# Patient Record
Sex: Male | Born: 1975
Health system: Southern US, Community
[De-identification: ages and names within clinical notes are randomized; demographics above are authoritative.]

## PROBLEM LIST (undated history)

## (undated) DIAGNOSIS — S73199A Other sprain of unspecified hip, initial encounter: Secondary | ICD-10-CM

## (undated) DIAGNOSIS — T7840XA Allergy, unspecified, initial encounter: Secondary | ICD-10-CM

## (undated) HISTORY — DX: Allergy, unspecified, initial encounter: T78.40XA

## (undated) HISTORY — DX: Other sprain of unspecified hip, initial encounter: S73.199A

## (undated) HISTORY — PX: WISDOM TOOTH EXTRACTION: SHX21

---

## 1999-03-25 ENCOUNTER — Encounter: Payer: Self-pay | Admitting: *Deleted

## 1999-03-25 ENCOUNTER — Emergency Department (HOSPITAL_COMMUNITY): Admission: EM | Admit: 1999-03-25 | Discharge: 1999-03-25 | Payer: Self-pay | Admitting: *Deleted

## 1999-04-01 ENCOUNTER — Emergency Department (HOSPITAL_COMMUNITY): Admission: EM | Admit: 1999-04-01 | Discharge: 1999-04-01 | Payer: Self-pay | Admitting: Emergency Medicine

## 2001-08-13 ENCOUNTER — Emergency Department (HOSPITAL_COMMUNITY): Admission: EM | Admit: 2001-08-13 | Discharge: 2001-08-13 | Payer: Self-pay | Admitting: Emergency Medicine

## 2009-10-29 ENCOUNTER — Ambulatory Visit: Payer: Self-pay | Admitting: Sports Medicine

## 2009-10-29 DIAGNOSIS — M7660 Achilles tendinitis, unspecified leg: Secondary | ICD-10-CM | POA: Insufficient documentation

## 2009-10-29 DIAGNOSIS — M79609 Pain in unspecified limb: Secondary | ICD-10-CM | POA: Insufficient documentation

## 2009-11-26 ENCOUNTER — Ambulatory Visit: Payer: Self-pay | Admitting: Sports Medicine

## 2010-03-27 NOTE — Assessment & Plan Note (Signed)
Summary: NP,LEG PAIN WHILE RUNNING,MC   Vital Signs:  Patient profile:   35 year old male Height:      72 inches Weight:      175 pounds BMI:     23.82 BP sitting:   101 / 70  Vitals Entered By: Lillia Pauls CMA (October 29, 2009 10:45 AM)  History of Present Illness:  Bilat achilles that has been gradually worsening since mid July 2011.  Attempted first barefoot fun prior to onset; shortly after noticed sensation of achilles tendon tightness and later developed pain.  Only did one barefoot run and then went back to shoes.  Was attempting to train for 1/2 marathon but pain worsened with increase in mileage.  Knee pain (diffuse quads tendons) developed several weeks after achilles pain - pt wonders if this was caused by a change in his mechanics; this pain would also worsen with activity.  Pain has progressed to the point where he stopped running 2 weeks ago.  No parasthesias, LE weakness, or prior injurys.  Started running Oct 2010 - did P90x prior as main form of exercise.     Allergies (verified): No Known Drug Allergies  Physical Exam  General:  Well-developed,well-nourished,in no acute distress; alert,appropriate and cooperative throughout examination Msk:  good overall strength of hip abductors and flexors equal leg length moderate arch w only slt pronation  slt thickening in RT AT and mildly TTP LT AT feels norm and does not seem tender  gait is midfoot strike running form looks good pronation is not excessive Additional Exam:  MSK [place] thickened nodule on RT AT of 0.58 no noevessels no tears some edema noted on trans scan  LT AT is about 0.52 at this area and essentially looks norm  images saved   Impression & Recommendations:  Problem # 1:  HEEL PAIN, BILATERAL (ICD-729.5)  Left is pretty mild will use exercises and ice  RT is more severe and would use top ketoprofen gel two times a day to qid  heel lifts bilat sports insoles  Orders: Sports Insoles  (E4540)  Problem # 2:  ACHILLES TENDINITIS (ICD-726.71)  RT has nodule but no tear will begin rehab protocol OK to run with heel lift but keep effort to 70%  reck in 1 month  Orders: Sports Insoles (J8119)  Complete Medication List: 1)  Ketoprofen Gel 20%  .... Aaa tid Prescriptions: KETOPROFEN GEL 20% AAA TID  #60 GRMS x PRN   Entered by:   Lillia Pauls CMA   Authorized by:   Enid Baas MD   Signed by:   Lillia Pauls CMA on 10/29/2009   Method used:   Print then Give to Patient   RxID:   [id number]

## 2010-03-27 NOTE — Assessment & Plan Note (Signed)
Summary: F/U ACHILLIES,MC   Vital Signs:  Patient profile:   35 year old male BP sitting:   104 / 68  Vitals Entered By: Rochele Pages RN (November 26, 2009 10:50 AM)  History of Present Illness: 35 yo M with bilateral achilles pain here for follow up.  Since his last visit, patient has not been able to do the exercises due to pain after trying them. He also has not been able to resume running. He feels some stiffness in the morning and then discomfort after the exercises. He reports new pain in his R heel going under the heel after trying the exercises.  He only recently got the prescription filled for the Ketoprofen gel and has not really tried it.  Allergies: No Known Drug Allergies  Physical Exam  General:  Well-developed,well-nourished,in no acute distress; alert,appropriate and cooperative throughout examination Msk:  Foot: Feet appear symmetric without edema or deformities.  Pain with compression of R achilles just above insertion on calcaneus. Small nodule palpable, consistent with previous exam.  5/5 strength, full ROM on dorsiflexion, plantarflexion, inversion, eversion bilaterally.  Limited ROM of great toe bilaterally with 10-15 degrees of extension, 45-60 degrees flexion. Neurologic:  Neutral gait.   Impression & Recommendations:  Problem # 1:  ACHILLES TENDINITIS (ICD-726.71) Modified exercises as detailed in Pt. Instructions so that patient will be able to tolerate them without pain. Instructions include gradual process for increasing difficulty of exercises. Pt. will start ketoprofen gel.  will rescan on RTC if noit improved consider NTG  Return to clinic in 6 weeks for f/u. Orders: Foot Orthosis ( Arch Strap/Heel Cup) [phone]) Sports Insoles [phone])  Complete Medication List: 1)  Ketoprofen Gel 20%  .... Aaa tid  Patient Instructions: 1)  Begin exercises off a book about 2 in high 2)  go down on both feet - down slow and up quickly 3)  3 sets of 15 with  knees straight 4)  3 setrs of 15 with knees bent 5)  When this does not hurt; 6)  start 1 set on RT leg with only 1 foot 7)  after that is pain free progress to 2 sets on 1 foot only and gradually to 3 sets 8)  this is absolutely essential to eventually healing the nodule 9)  after pain free on 3 sets of 15 with 1 leg - you could start some easy running 10)  now safer to use elliptical or treadmill 11)  reck 6 weeks

## 2010-06-16 ENCOUNTER — Encounter: Payer: Self-pay | Admitting: *Deleted

## 2011-12-04 ENCOUNTER — Ambulatory Visit (INDEPENDENT_AMBULATORY_CARE_PROVIDER_SITE_OTHER): Payer: BC Managed Care – PPO | Admitting: Family Medicine

## 2011-12-04 VITALS — BP 120/70 | Ht 71.0 in | Wt 175.0 lb

## 2011-12-04 DIAGNOSIS — M84369A Stress fracture, unspecified tibia and fibula, initial encounter for fracture: Secondary | ICD-10-CM | POA: Insufficient documentation

## 2011-12-04 MED ORDER — MELOXICAM 15 MG PO TABS: 15.0000 mg | ORAL_TABLET | Freq: Every day | ORAL | Status: DC

## 2011-12-04 NOTE — Assessment & Plan Note (Signed)
Patient hasn't seen ultrasound today with potential stress reaction but mostly hypoechoic changes surrounding the bone. There is also findings that show anterior tibialis stress and potential injury. Patient given sports insults today to try to help with the vibration. Patient to the compression sleeve to try to help take the pressure off of the anterior tibialis Patient given exercises and stretches to on a regular basis and encouraged to cross train. Patient will come back in 3-4 weeks for further evaluation if still having any pain. Gave patient red flags and when a stress reaction recurs and becomes a true stress fracture.

## 2011-12-04 NOTE — Patient Instructions (Addendum)
Very nice to meet you Try the sleeve compression with exercise and 2 hours afterward.  Wear the insoles Do the exercises given.  Also would cross train doing biking and swimming or elliptical. You can run 2 times a week.  I am sending in a medicine for you as well. Take 1 pill daily for 2 weeks then as needed.  I want to see you again in 3-4 weeks to make sure you are ready for your marathon.

## 2011-12-04 NOTE — Progress Notes (Signed)
Chief complaint: Left lower leg pain  History of present illness: Patient is a 36 year old male who is running in trying to increase his endurance to do the [employer]. Patient over the course of last couple weeks he started having pain in his left lower treatment. Patient states that the pain has increased recently. Patient states that it mostly happens over the tibia bone. Patient was seen in urgent care facility where he did have x-rays done. X-rays were reviewed by me today and does not show any bony abnormalities or any signs of fracture or stress reaction. Patient states that it is still very tender and is stopping him from running. Patient noticed that the first time after he ran 12-1/2 miles which was his for this he ran at one time in quite a while. Patient denies any injury to this had previously. Patient has now stopped running quite some much and has had improvement over the course of last week. Patient denies any numbness or radiation of pain or any swelling. Patient states that the pain has woken him up at night denies any fevers, chills, or any abnormal weight loss.  Past medical surgical and family history reviewed without any changes from previous exam back in 2001  Physical exam Blood pressure 120/70, height 5\' 11"  (1.803 m), weight 175 lb (79.379 kg). General: No apparent distress alert and oriented mood and affect normal Respiratory: Patient states pulses is a does not appear short of breath Gait analysis shows some external tibial rotation of the right side. Patient is favoring his left leg. Skin: Warm dry intact no signs of infection Left leg exam: Shows full range of motion of the knee and is nontender as well as the ankle which is also nontender. Patient does have pain on the medial aspect of the anterior tibia as well as pain over the anterior tibialis muscle. These occurred midshaft minimal almost directly in the middle. Neurovascularly intact distally with 2+ patellar  and Achilles tendon reflex  Ultrasound performed and interpreted by me today. Patient has findings of a potential anterior tibialis tear with some neovascularization and hypoechoic changes where it attaches in the area where patient is most tender. In addition this patient does have mild stress reaction of the bone but no cortical defect. Patient does have overlying hypoechoic changes on the bone signaling potential stress reaction.

## 2012-01-01 ENCOUNTER — Ambulatory Visit: Payer: BC Managed Care – PPO | Admitting: Family Medicine

## 2013-07-14 ENCOUNTER — Encounter: Payer: Self-pay | Admitting: Family Medicine

## 2013-07-14 ENCOUNTER — Ambulatory Visit
Admission: RE | Admit: 2013-07-14 | Discharge: 2013-07-14 | Disposition: A | Discharge status: Home / Self Care | Payer: BC Managed Care – PPO | Source: Ambulatory Visit | Attending: Family Medicine | Admitting: Family Medicine

## 2013-07-14 ENCOUNTER — Ambulatory Visit (INDEPENDENT_AMBULATORY_CARE_PROVIDER_SITE_OTHER): Payer: BC Managed Care – PPO | Admitting: Family Medicine

## 2013-07-14 VITALS — BP 118/75 | Ht 72.0 in | Wt 185.0 lb

## 2013-07-14 DIAGNOSIS — M79609 Pain in unspecified limb: Secondary | ICD-10-CM

## 2013-07-14 DIAGNOSIS — M79671 Pain in right foot: Secondary | ICD-10-CM

## 2013-07-14 NOTE — Patient Instructions (Signed)
Thank you for coming in today  We are concerned that you may have a stress reaction of the 5th metatarsal Get xrays Post op shoe for 3 weeks Exercise okay if it is not painful - recommend bike, swimming. No hiking, running. Elliptical only if pain free.  Followup 3 weeks

## 2013-07-14 NOTE — Progress Notes (Signed)
CC: Right foot pain HPI: Patient is a very pleasant 38 year old male referred by Ellamae Sia for evaluation of right lateral foot pain. He states that he sprained his ankle on April 4. This was a high ankle sprain. He treated this with a lace up ankle brace. He noticed that after the ankle sprain while he was wearing the brace he started to get lateral foot pain at the base of his fifth metatarsal. He has been in rehabilitation for his ankle sprain with Ellamae Sia but continues to have persistent and significant lateral foot pain and swelling but is not improving. John had some concern that this might be a Jones fracture and referred him here for evaluation. He states that his pain is exacerbated by walking and hiking. He also gets some pain if he crosses his legs.  ROS: As above in the HPI. All other systems are stable or negative.  OBJECTIVE: APPEARANCE:  Patient in no acute distress.The patient appeared well nourished and normally developed. HEENT: No scleral icterus. Conjunctiva non-injected Resp: Non labored Skin: No rash MSK:  Right Foot exam:  - No swelling or deformity - Full range of motion of the ankle - Strength is 5 out of 5 in the ankle in all directions - No pain with plantarflexion and eversion of the foot - He is tender predominately at the base of the fifth metatarsal. There is also some midshaft tenderness. - Non-tender just proximal to the base of the fifth where the peroneal brevis inserts  MSK [place]: Ultrasound of the right foot was performed today to evaluate the fifth metatarsal. Fifth metatarsal visualized in transverse and longitudinal views. No cortical disruption. No hypoechoic change overlying the bony cortex. There was increased Doppler signal on the cortex at the area of the patient's pain suggestive of possible stress reaction. Distal Peroneal brevis tendon has small hypoechoic fluid pocket overlying it but this is nontender and not the site of the  patient's pain.  ASSESSMENT: #1. Right lateral foot pain with concern for fifth metatarsal stress reaction versus stress fracture   PLAN: We will get x-rays of the foot to rule out fracture. His pain has been present for several weeks now and so there was a fracture we should see evidence of callus formation. We will place him in a postop shoe for the next 3 weeks. He was encouraged to avoid any activity that increases his pain. We will see him back in 3 weeks.

## 2013-07-18 ENCOUNTER — Telehealth: Payer: Self-pay | Admitting: Family Medicine

## 2013-07-18 NOTE — Telephone Encounter (Signed)
Called patient to discuss xray and answer questions. Unfortunately had worse pain in post op shoe. Went back into sneaker and still with pain but less than in post op shoe. We will have him go non-WB on crutches for 1 week and then retry post op shoe. If this is still painful go back to sneaker. Move up followup to June 5th for reeval. May need to reconsider etiology of pain as possibly peroneus instead of 5th MT as 5th MT should not hurt in post op shoe. May need to consider walking boot on followup.

## 2013-08-04 ENCOUNTER — Ambulatory Visit: Payer: BC Managed Care – PPO | Admitting: Family Medicine

## 2016-04-10 DIAGNOSIS — Z0000 Encounter for general adult medical examination without abnormal findings: Secondary | ICD-10-CM | POA: Diagnosis not present

## 2016-04-10 DIAGNOSIS — Z125 Encounter for screening for malignant neoplasm of prostate: Secondary | ICD-10-CM | POA: Diagnosis not present

## 2016-04-17 DIAGNOSIS — Z0000 Encounter for general adult medical examination without abnormal findings: Secondary | ICD-10-CM | POA: Diagnosis not present

## 2016-05-29 DIAGNOSIS — S76111A Strain of right quadriceps muscle, fascia and tendon, initial encounter: Secondary | ICD-10-CM | POA: Diagnosis not present

## 2016-05-29 DIAGNOSIS — M79671 Pain in right foot: Secondary | ICD-10-CM | POA: Diagnosis not present

## 2016-06-26 DIAGNOSIS — S76111A Strain of right quadriceps muscle, fascia and tendon, initial encounter: Secondary | ICD-10-CM | POA: Diagnosis not present

## 2016-06-26 DIAGNOSIS — M79671 Pain in right foot: Secondary | ICD-10-CM | POA: Diagnosis not present

## 2016-11-05 DIAGNOSIS — M545 Low back pain: Secondary | ICD-10-CM | POA: Diagnosis not present

## 2016-12-29 DIAGNOSIS — M25551 Pain in right hip: Secondary | ICD-10-CM | POA: Diagnosis not present

## 2016-12-29 DIAGNOSIS — M25552 Pain in left hip: Secondary | ICD-10-CM | POA: Diagnosis not present

## 2017-01-21 DIAGNOSIS — M25552 Pain in left hip: Secondary | ICD-10-CM | POA: Diagnosis not present

## 2017-01-21 DIAGNOSIS — M25551 Pain in right hip: Secondary | ICD-10-CM | POA: Diagnosis not present

## 2017-01-28 DIAGNOSIS — M25551 Pain in right hip: Secondary | ICD-10-CM | POA: Diagnosis not present

## 2017-01-28 DIAGNOSIS — M25552 Pain in left hip: Secondary | ICD-10-CM | POA: Diagnosis not present

## 2017-02-23 DIAGNOSIS — S73199A Other sprain of unspecified hip, initial encounter: Secondary | ICD-10-CM

## 2017-02-23 HISTORY — DX: Other sprain of unspecified hip, initial encounter: S73.199A

## 2017-03-11 ENCOUNTER — Encounter: Payer: Self-pay | Admitting: Family Medicine

## 2017-03-11 ENCOUNTER — Ambulatory Visit (INDEPENDENT_AMBULATORY_CARE_PROVIDER_SITE_OTHER): Payer: 59 | Admitting: Family Medicine

## 2017-03-11 VITALS — BP 110/78 | HR 68 | Temp 97.4°F | Ht 71.5 in | Wt 176.2 lb

## 2017-03-11 DIAGNOSIS — Z23 Encounter for immunization: Secondary | ICD-10-CM

## 2017-03-11 DIAGNOSIS — Z0000 Encounter for general adult medical examination without abnormal findings: Secondary | ICD-10-CM | POA: Diagnosis not present

## 2017-03-11 LAB — HEPATIC FUNCTION PANEL
ALBUMIN: 4.7 g/dL (ref 3.5–5.2)
ALK PHOS: 45 U/L (ref 39–117)
ALT: 24 U/L (ref 0–53)
AST: 21 U/L (ref 0–37)
BILIRUBIN DIRECT: 0.2 mg/dL (ref 0.0–0.3)
Total Bilirubin: 0.8 mg/dL (ref 0.2–1.2)
Total Protein: 7.1 g/dL (ref 6.0–8.3)

## 2017-03-11 LAB — POC URINALSYSI DIPSTICK (AUTOMATED)
Bilirubin, UA: NEGATIVE
GLUCOSE UA: NEGATIVE
Ketones, UA: NEGATIVE
Leukocytes, UA: NEGATIVE
NITRITE UA: NEGATIVE
PROTEIN UA: NEGATIVE
RBC UA: NEGATIVE
Spec Grav, UA: 1.015 (ref 1.010–1.025)
UROBILINOGEN UA: 0.2 U/dL
pH, UA: 7.5 (ref 5.0–8.0)

## 2017-03-11 LAB — CBC WITH DIFFERENTIAL/PLATELET
BASOS PCT: 0.7 % (ref 0.0–3.0)
Basophils Absolute: 0 10*3/uL (ref 0.0–0.1)
EOS ABS: 0.2 10*3/uL (ref 0.0–0.7)
Eosinophils Relative: 3.8 % (ref 0.0–5.0)
HEMATOCRIT: 47.9 % (ref 39.0–52.0)
HEMOGLOBIN: 15.9 g/dL (ref 13.0–17.0)
LYMPHS PCT: 36.5 % (ref 12.0–46.0)
Lymphs Abs: 1.8 10*3/uL (ref 0.7–4.0)
MCHC: 33.2 g/dL (ref 30.0–36.0)
MCV: 92.3 fl (ref 78.0–100.0)
Monocytes Absolute: 0.4 10*3/uL (ref 0.1–1.0)
Monocytes Relative: 8.8 % (ref 3.0–12.0)
Neutro Abs: 2.4 10*3/uL (ref 1.4–7.7)
Neutrophils Relative %: 50.2 % (ref 43.0–77.0)
Platelets: 204 10*3/uL (ref 150.0–400.0)
RBC: 5.19 Mil/uL (ref 4.22–5.81)
RDW: 12.8 % (ref 11.5–15.5)
WBC: 4.8 10*3/uL (ref 4.0–10.5)

## 2017-03-11 LAB — BASIC METABOLIC PANEL
BUN: 12 mg/dL (ref 6–23)
CO2: 33 mEq/L — ABNORMAL HIGH (ref 19–32)
Calcium: 9.3 mg/dL (ref 8.4–10.5)
Chloride: 101 mEq/L (ref 96–112)
Creatinine, Ser: 0.79 mg/dL (ref 0.40–1.50)
GFR: 113.59 mL/min (ref >60.00)
Glucose, Bld: 90 mg/dL (ref 70–99)
Potassium: 4.3 mEq/L (ref 3.5–5.1)
Sodium: 139 mEq/L (ref 135–145)

## 2017-03-11 LAB — LIPID PANEL
Cholesterol: 198 mg/dL (ref 0–200)
HDL: 60.6 mg/dL (ref >39.00)
LDL Cholesterol: 123 mg/dL — ABNORMAL HIGH (ref 0–99)
NonHDL: 137.59
Total CHOL/HDL Ratio: 3
Triglycerides: 75 mg/dL (ref 0.0–149.0)
VLDL: 15 mg/dL (ref 0.0–40.0)

## 2017-03-11 LAB — TSH: TSH: 1.58 u[IU]/mL (ref 0.35–4.50)

## 2017-03-11 MED ORDER — FLUTICASONE PROPIONATE 50 MCG/ACT NA SUSP: 2.0000 | Freq: Every day | NASAL | 11 refills | Status: DC

## 2017-03-11 NOTE — Progress Notes (Signed)
   Subjective:    Patient ID: Marcus Bell, male    DOB: 27-Apr-1975, 42 y.o.   MRN: [id number]  HPI 42 yr old male to establish and for a well exam. He feels great and has no concerns. He runs for exercise. He is an Teaching laboratory technicianassociate pastor.    Review of Systems  Constitutional: Negative.   HENT: Negative.   Eyes: Negative.   Respiratory: Negative.   Cardiovascular: Negative.   Gastrointestinal: Negative.   Genitourinary: Negative.   Musculoskeletal: Negative.   Skin: Negative.   Neurological: Negative.   Psychiatric/Behavioral: Negative.        Objective:   Physical Exam  Constitutional: He is oriented to person, place, and time. He appears well-developed and well-nourished. No distress.  HENT:  Head: Normocephalic and atraumatic.  Right Ear: External ear normal.  Left Ear: External ear normal.  Nose: Nose normal.  Mouth/Throat: Oropharynx is clear and moist. No oropharyngeal exudate.  Eyes: Conjunctivae and EOM are normal. Pupils are equal, round, and reactive to light. Right eye exhibits no discharge. Left eye exhibits no discharge. No scleral icterus.  Neck: Neck supple. No JVD present. No tracheal deviation present. No thyromegaly present.  Cardiovascular: Normal rate, regular rhythm, normal heart sounds and intact distal pulses. Exam reveals no gallop and no friction rub.  No murmur heard. Pulmonary/Chest: Effort normal and breath sounds normal. No respiratory distress. He has no wheezes. He has no rales. He exhibits no tenderness.  Abdominal: Soft. Bowel sounds are normal. He exhibits no distension and no mass. There is no tenderness. There is no rebound and no guarding.  Genitourinary: Rectum normal, prostate normal and penis normal. Rectal exam shows guaiac negative stool. No penile tenderness.  Musculoskeletal: Normal range of motion. He exhibits no edema or tenderness.  Lymphadenopathy:    He has no cervical adenopathy.  Neurological: He is alert and oriented to person,  place, and time. He has normal reflexes. No cranial nerve deficit. He exhibits normal muscle tone. Coordination normal.  Skin: Skin is warm and dry. No rash noted. He is not diaphoretic. No erythema. No pallor.  Psychiatric: He has a normal mood and affect. His behavior is normal. Judgment and thought content normal.          Assessment & Plan:  Intro visit and well exam. We discussed diet and exercise. Get fasting labs.  Gershon CraneStephen Cayden Rautio, MD

## 2017-04-04 ENCOUNTER — Encounter: Payer: Self-pay | Admitting: Family Medicine

## 2017-04-05 ENCOUNTER — Encounter: Payer: Self-pay | Admitting: *Deleted

## 2017-04-05 NOTE — Telephone Encounter (Signed)
Have him make a 15 minute OV to go over all these things. With the family hx of prostate cancer, he needs to have his prostate checked every year after the age of 42. We did a recent physical, but he needs a digital exam and a PSA. We can answer his other questions at the same visit

## 2017-04-16 ENCOUNTER — Ambulatory Visit (INDEPENDENT_AMBULATORY_CARE_PROVIDER_SITE_OTHER): Payer: 59 | Admitting: Family Medicine

## 2017-04-16 DIAGNOSIS — Z23 Encounter for immunization: Secondary | ICD-10-CM

## 2017-05-07 ENCOUNTER — Ambulatory Visit: Payer: 59

## 2017-07-16 DIAGNOSIS — M7662 Achilles tendinitis, left leg: Secondary | ICD-10-CM | POA: Diagnosis not present

## 2017-09-02 DIAGNOSIS — M7661 Achilles tendinitis, right leg: Secondary | ICD-10-CM | POA: Diagnosis not present

## 2017-09-02 DIAGNOSIS — M7662 Achilles tendinitis, left leg: Secondary | ICD-10-CM | POA: Diagnosis not present

## 2017-09-21 DIAGNOSIS — S70362D Insect bite (nonvenomous), left thigh, subsequent encounter: Secondary | ICD-10-CM | POA: Diagnosis not present

## 2017-11-08 DIAGNOSIS — M7661 Achilles tendinitis, right leg: Secondary | ICD-10-CM | POA: Diagnosis not present

## 2017-11-08 DIAGNOSIS — M7662 Achilles tendinitis, left leg: Secondary | ICD-10-CM | POA: Diagnosis not present

## 2017-11-23 DIAGNOSIS — M7661 Achilles tendinitis, right leg: Secondary | ICD-10-CM | POA: Diagnosis not present

## 2017-11-23 DIAGNOSIS — M7662 Achilles tendinitis, left leg: Secondary | ICD-10-CM | POA: Diagnosis not present

## 2017-11-29 DIAGNOSIS — M7661 Achilles tendinitis, right leg: Secondary | ICD-10-CM | POA: Diagnosis not present

## 2017-11-29 DIAGNOSIS — M7662 Achilles tendinitis, left leg: Secondary | ICD-10-CM | POA: Diagnosis not present

## 2017-12-02 DIAGNOSIS — M7662 Achilles tendinitis, left leg: Secondary | ICD-10-CM | POA: Diagnosis not present

## 2017-12-02 DIAGNOSIS — M7661 Achilles tendinitis, right leg: Secondary | ICD-10-CM | POA: Diagnosis not present

## 2017-12-09 DIAGNOSIS — M7661 Achilles tendinitis, right leg: Secondary | ICD-10-CM | POA: Diagnosis not present

## 2017-12-09 DIAGNOSIS — M7662 Achilles tendinitis, left leg: Secondary | ICD-10-CM | POA: Diagnosis not present

## 2017-12-13 DIAGNOSIS — M25551 Pain in right hip: Secondary | ICD-10-CM | POA: Diagnosis not present

## 2017-12-13 DIAGNOSIS — M7061 Trochanteric bursitis, right hip: Secondary | ICD-10-CM | POA: Diagnosis not present

## 2017-12-16 DIAGNOSIS — M9904 Segmental and somatic dysfunction of sacral region: Secondary | ICD-10-CM | POA: Diagnosis not present

## 2017-12-16 DIAGNOSIS — M9902 Segmental and somatic dysfunction of thoracic region: Secondary | ICD-10-CM | POA: Diagnosis not present

## 2017-12-16 DIAGNOSIS — M9903 Segmental and somatic dysfunction of lumbar region: Secondary | ICD-10-CM | POA: Diagnosis not present

## 2017-12-20 DIAGNOSIS — M9903 Segmental and somatic dysfunction of lumbar region: Secondary | ICD-10-CM | POA: Diagnosis not present

## 2017-12-20 DIAGNOSIS — M9902 Segmental and somatic dysfunction of thoracic region: Secondary | ICD-10-CM | POA: Diagnosis not present

## 2017-12-20 DIAGNOSIS — M9904 Segmental and somatic dysfunction of sacral region: Secondary | ICD-10-CM | POA: Diagnosis not present

## 2017-12-27 DIAGNOSIS — M9902 Segmental and somatic dysfunction of thoracic region: Secondary | ICD-10-CM | POA: Diagnosis not present

## 2017-12-27 DIAGNOSIS — M9903 Segmental and somatic dysfunction of lumbar region: Secondary | ICD-10-CM | POA: Diagnosis not present

## 2017-12-27 DIAGNOSIS — M9904 Segmental and somatic dysfunction of sacral region: Secondary | ICD-10-CM | POA: Diagnosis not present

## 2017-12-31 ENCOUNTER — Other Ambulatory Visit: Payer: Self-pay | Admitting: Family Medicine

## 2017-12-31 DIAGNOSIS — M25561 Pain in right knee: Secondary | ICD-10-CM

## 2018-01-03 DIAGNOSIS — M9902 Segmental and somatic dysfunction of thoracic region: Secondary | ICD-10-CM | POA: Diagnosis not present

## 2018-01-03 DIAGNOSIS — M9904 Segmental and somatic dysfunction of sacral region: Secondary | ICD-10-CM | POA: Diagnosis not present

## 2018-01-03 DIAGNOSIS — M9903 Segmental and somatic dysfunction of lumbar region: Secondary | ICD-10-CM | POA: Diagnosis not present

## 2018-01-17 DIAGNOSIS — M9904 Segmental and somatic dysfunction of sacral region: Secondary | ICD-10-CM | POA: Diagnosis not present

## 2018-01-17 DIAGNOSIS — M9902 Segmental and somatic dysfunction of thoracic region: Secondary | ICD-10-CM | POA: Diagnosis not present

## 2018-01-17 DIAGNOSIS — M9903 Segmental and somatic dysfunction of lumbar region: Secondary | ICD-10-CM | POA: Diagnosis not present

## 2018-01-25 DIAGNOSIS — M25552 Pain in left hip: Secondary | ICD-10-CM | POA: Diagnosis not present

## 2018-01-25 DIAGNOSIS — M25551 Pain in right hip: Secondary | ICD-10-CM | POA: Diagnosis not present

## 2018-01-25 DIAGNOSIS — M545 Low back pain: Secondary | ICD-10-CM | POA: Diagnosis not present

## 2018-01-26 NOTE — Progress Notes (Signed)
[facility] Marcus Bell [facility] by the undersigned to the LT deltoid. Lot# [id number] NDC: [id number] Mfg: [employer] Exp: 08/23/18

## 2018-02-07 DIAGNOSIS — M25551 Pain in right hip: Secondary | ICD-10-CM | POA: Diagnosis not present

## 2018-02-09 DIAGNOSIS — M25551 Pain in right hip: Secondary | ICD-10-CM | POA: Diagnosis not present

## 2018-03-23 DIAGNOSIS — M25551 Pain in right hip: Secondary | ICD-10-CM | POA: Diagnosis not present

## 2018-04-04 DIAGNOSIS — M25551 Pain in right hip: Secondary | ICD-10-CM | POA: Diagnosis not present

## 2018-04-04 DIAGNOSIS — M25552 Pain in left hip: Secondary | ICD-10-CM | POA: Diagnosis not present

## 2018-04-04 DIAGNOSIS — M545 Low back pain: Secondary | ICD-10-CM | POA: Diagnosis not present

## 2018-04-15 ENCOUNTER — Ambulatory Visit (INDEPENDENT_AMBULATORY_CARE_PROVIDER_SITE_OTHER): Payer: 59 | Admitting: Family Medicine

## 2018-04-15 ENCOUNTER — Encounter: Payer: 59 | Admitting: Family Medicine

## 2018-04-15 ENCOUNTER — Encounter: Payer: Self-pay | Admitting: Family Medicine

## 2018-04-15 VITALS — BP 110/62 | HR 66 | Temp 97.5°F | Ht 71.0 in | Wt 179.2 lb

## 2018-04-15 DIAGNOSIS — Z0000 Encounter for general adult medical examination without abnormal findings: Secondary | ICD-10-CM | POA: Diagnosis not present

## 2018-04-15 DIAGNOSIS — H9313 Tinnitus, bilateral: Secondary | ICD-10-CM | POA: Diagnosis not present

## 2018-04-15 NOTE — Progress Notes (Signed)
   Subjective:    Patient ID: Marcus Bell, male    DOB: 1975-08-18, 43 y.o.   MRN: [id number]  HPI Here for a well exam. He feels well but has one concern. About 2 months ago he developed a high pitched ringing in both ear. No pain or hearing loss. No dizziness. No particular exposure to loud noises that he remembers.    Review of Systems  Constitutional: Negative.   HENT: Positive for tinnitus.   Eyes: Negative.   Respiratory: Negative.   Cardiovascular: Negative.   Gastrointestinal: Negative.   Genitourinary: Negative.   Musculoskeletal: Negative.   Skin: Negative.   Neurological: Negative.   Psychiatric/Behavioral: Negative.        Objective:   Physical Exam Constitutional:      General: He is not in acute distress.    Appearance: He is well-developed. He is not diaphoretic.  HENT:     Head: Normocephalic and atraumatic.     Right Ear: External ear normal.     Left Ear: External ear normal.     Nose: Nose normal.     Mouth/Throat:     Pharynx: No oropharyngeal exudate.  Eyes:     General: No scleral icterus.       Right eye: No discharge.        Left eye: No discharge.     Conjunctiva/sclera: Conjunctivae normal.     Pupils: Pupils are equal, round, and reactive to light.  Neck:     Musculoskeletal: Neck supple.     Thyroid: No thyromegaly.     Vascular: No JVD.     Trachea: No tracheal deviation.  Cardiovascular:     Rate and Rhythm: Normal rate and regular rhythm.     Heart sounds: Normal heart sounds. No murmur. No friction rub. No gallop.   Pulmonary:     Effort: Pulmonary effort is normal. No respiratory distress.     Breath sounds: Normal breath sounds. No wheezing or rales.  Chest:     Chest wall: No tenderness.  Abdominal:     General: Bowel sounds are normal. There is no distension.     Palpations: Abdomen is soft. There is no mass.     Tenderness: There is no abdominal tenderness. There is no guarding or rebound.  Genitourinary:  Penis: Normal. No tenderness.      Scrotum/Testes: Normal.  Musculoskeletal: Normal range of motion.        General: No tenderness.  Lymphadenopathy:     Cervical: No cervical adenopathy.  Skin:    General: Skin is warm and dry.     Coloration: Skin is not pale.     Findings: No erythema or rash.  Neurological:     Mental Status: He is alert and oriented to person, place, and time.     Cranial Nerves: No cranial nerve deficit.     Motor: No abnormal muscle tone.     Coordination: Coordination normal.     Deep Tendon Reflexes: Reflexes are normal and symmetric. Reflexes normal.  Psychiatric:        Behavior: Behavior normal.        Thought Content: Thought content normal.        Judgment: Judgment normal.           Assessment & Plan:  Well exam. We discussed diet and exercise. He will return next week for fasting labs. Refer to ENT for the tinnitus. Gershon Crane, MD

## 2018-04-20 ENCOUNTER — Ambulatory Visit: Payer: 59

## 2018-04-20 DIAGNOSIS — Z0000 Encounter for general adult medical examination without abnormal findings: Secondary | ICD-10-CM

## 2018-04-20 LAB — HEPATIC FUNCTION PANEL
ALBUMIN: 4.5 g/dL (ref 3.5–5.2)
ALT: 21 U/L (ref 0–53)
AST: 18 U/L (ref 0–37)
Alkaline Phosphatase: 49 U/L (ref 39–117)
Bilirubin, Direct: 0.1 mg/dL (ref 0.0–0.3)
Total Bilirubin: 0.5 mg/dL (ref 0.2–1.2)
Total Protein: 6.8 g/dL (ref 6.0–8.3)

## 2018-04-20 LAB — BASIC METABOLIC PANEL
BUN: 15 mg/dL (ref 6–23)
CO2: 31 mEq/L (ref 19–32)
Calcium: 9.2 mg/dL (ref 8.4–10.5)
Chloride: 102 mEq/L (ref 96–112)
Creatinine, Ser: 0.93 mg/dL (ref 0.40–1.50)
GFR: 88.08 mL/min (ref >60.00)
Glucose, Bld: 91 mg/dL (ref 70–99)
Potassium: 4.2 mEq/L (ref 3.5–5.1)
Sodium: 139 mEq/L (ref 135–145)

## 2018-04-20 LAB — CBC WITH DIFFERENTIAL/PLATELET
BASOS PCT: 0.5 % (ref 0.0–3.0)
Basophils Absolute: 0 10*3/uL (ref 0.0–0.1)
Eosinophils Absolute: 0.1 10*3/uL (ref 0.0–0.7)
Eosinophils Relative: 3.7 % (ref 0.0–5.0)
HCT: 46.8 % (ref 39.0–52.0)
Hemoglobin: 15.7 g/dL (ref 13.0–17.0)
Lymphocytes Relative: 39.7 % (ref 12.0–46.0)
Lymphs Abs: 1.6 10*3/uL (ref 0.7–4.0)
MCHC: 33.6 g/dL (ref 30.0–36.0)
MCV: 91.4 fl (ref 78.0–100.0)
Monocytes Absolute: 0.3 10*3/uL (ref 0.1–1.0)
Monocytes Relative: 8.4 % (ref 3.0–12.0)
Neutro Abs: 1.9 10*3/uL (ref 1.4–7.7)
Neutrophils Relative %: 47.7 % (ref 43.0–77.0)
Platelets: 195 10*3/uL (ref 150.0–400.0)
RBC: 5.12 Mil/uL (ref 4.22–5.81)
RDW: 12.6 % (ref 11.5–15.5)
WBC: 4.1 10*3/uL (ref 4.0–10.5)

## 2018-04-20 LAB — POC URINALSYSI DIPSTICK (AUTOMATED)
BILIRUBIN UA: NEGATIVE
Blood, UA: NEGATIVE
Glucose, UA: NEGATIVE
Ketones, UA: NEGATIVE
Leukocytes, UA: NEGATIVE
Nitrite, UA: NEGATIVE
Protein, UA: NEGATIVE
Spec Grav, UA: 1.015 (ref 1.010–1.025)
Urobilinogen, UA: 0.2 E.U./dL
pH, UA: 8 (ref 5.0–8.0)

## 2018-04-20 LAB — LIPID PANEL
Cholesterol: 184 mg/dL (ref 0–200)
HDL: 59.9 mg/dL (ref >39.00)
LDL Cholesterol: 112 mg/dL — ABNORMAL HIGH (ref 0–99)
NonHDL: 124.56
Total CHOL/HDL Ratio: 3
Triglycerides: 62 mg/dL (ref 0.0–149.0)
VLDL: 12.4 mg/dL (ref 0.0–40.0)

## 2018-04-21 LAB — TSH: TSH: 1.74 u[IU]/mL (ref 0.35–4.50)

## 2018-04-22 ENCOUNTER — Encounter: Payer: 59 | Admitting: Family Medicine

## 2018-04-25 DIAGNOSIS — M25551 Pain in right hip: Secondary | ICD-10-CM | POA: Diagnosis not present

## 2018-04-25 DIAGNOSIS — M25552 Pain in left hip: Secondary | ICD-10-CM | POA: Diagnosis not present

## 2018-04-25 DIAGNOSIS — M545 Low back pain: Secondary | ICD-10-CM | POA: Diagnosis not present

## 2018-04-27 ENCOUNTER — Encounter: Payer: Self-pay | Admitting: *Deleted

## 2018-12-05 ENCOUNTER — Encounter: Payer: Self-pay | Admitting: Family Medicine

## 2018-12-06 NOTE — Telephone Encounter (Signed)
Please advise 

## 2018-12-06 NOTE — Telephone Encounter (Signed)
I would not be concerned about this. Your heart rate response is quite typical

## 2019-04-26 ENCOUNTER — Encounter: Payer: Self-pay | Admitting: Family Medicine

## 2019-04-26 DIAGNOSIS — H9319 Tinnitus, unspecified ear: Secondary | ICD-10-CM

## 2019-04-26 NOTE — Telephone Encounter (Signed)
I did a new referral  

## 2019-05-29 ENCOUNTER — Encounter (INDEPENDENT_AMBULATORY_CARE_PROVIDER_SITE_OTHER): Payer: Self-pay | Admitting: Otolaryngology

## 2019-05-29 ENCOUNTER — Other Ambulatory Visit: Payer: Self-pay

## 2019-05-29 ENCOUNTER — Ambulatory Visit (INDEPENDENT_AMBULATORY_CARE_PROVIDER_SITE_OTHER): Payer: 59 | Admitting: Otolaryngology

## 2019-05-29 VITALS — Temp 97.9°F

## 2019-05-29 DIAGNOSIS — H9313 Tinnitus, bilateral: Secondary | ICD-10-CM | POA: Diagnosis not present

## 2019-05-29 NOTE — Progress Notes (Signed)
HPI: Marcus Bell is a 44 y.o. male who presents is referred by Dr. Clent Ridges for evaluation of ringing in both ears.  He feels like he hears well in both ears.  He describes a constant high-pitched ringing that he can hear most times when he thinks about it.  He denies any recent loud noise exposure although he went to several concerts when he was younger.  He does use headphones but does not use a very loud volume.  He has noticed the tinnitus for about a year now.  Denies any ear pain..  Past Medical History:  Diagnosis Date  . Acetabular labrum tear 2019  . Allergy    No past surgical history on file. Social History   Socioeconomic History  . Marital status: Married    Spouse name: Not on file  . Number of children: Not on file  . Years of education: Not on file  . Highest education level: Not on file  Occupational History  . Not on file  Tobacco Use  . Smoking status: Never Smoker  . Smokeless tobacco: Never Used  Substance and Sexual Activity  . Alcohol use: Not on file  . Drug use: Not on file  . Sexual activity: Not on file  Other Topics Concern  . Not on file  Social History Narrative  . Not on file   Social Determinants of Health   Financial Resource Strain:   . Difficulty of Paying Living Expenses:   Food Insecurity:   . Worried About Programme researcher, broadcasting/film/video in the Last Year:   . Barista in the Last Year:   Transportation Needs:   . Freight forwarder (Medical):   Marland Kitchen Lack of Transportation (Non-Medical):   Physical Activity:   . Days of Exercise per Week:   . Minutes of Exercise per Session:   Stress:   . Feeling of Stress :   Social Connections:   . Frequency of Communication with Friends and Family:   . Frequency of Social Gatherings with Friends and Family:   . Attends Religious Services:   . Active Member of Clubs or Organizations:   . Attends Banker Meetings:   Marland Kitchen Marital Status:    Family History  Problem Relation Age of  Onset  . Cancer Father   . Charcot-Marie-Tooth disease Father   . Prostate cancer Father   . Charcot-Marie-Tooth disease Sister   . Asthma Daughter    No Known Allergies Prior to Admission medications   Medication Sig Start Date End Date Taking? Authorizing Provider  fluticasone (FLONASE) 50 MCG/ACT nasal spray Place 2 sprays into both nostrils daily. 03/11/17  Yes Nelwyn Salisbury, MD     Positive ROS: Otherwise negative  All other systems have been reviewed and were otherwise negative with the exception of those mentioned in the HPI and as above.  Physical Exam: Constitutional: Alert, well-appearing, no acute distress Ears: External ears without lesions or tenderness.  He had a large amount of wax in the left ear that was cleaned with suction.  He had minimal wax on the right side.  TMs were clear bilaterally.  On auscultation of the ears he has no objective tinnitus. Nasal: External nose without lesions. Septum midline.. Clear nasal passages Oral: Lips and gums without lesions. Tongue and palate mucosa without lesions. Posterior oropharynx clear. Neck: No palpable adenopathy or masses.  No carotid bruits Respiratory: Breathing comfortably  Skin: No facial/neck lesions or rash noted  Audiogram was  performed in the office today and this demonstrated normal hearing in both ears especially in the upper frequencies.  SRT's were 5 dB bilaterally with type A tympanograms bilaterally.  Procedures  Assessment: Tinnitus  Plan: He has normal hearing evaluation bilaterally. Reviewed with him concerning limited treatment for tinnitus.  Discussed using masking noise when the tinnitus is bothersome.  Also suggested trying Lipo flavonoid as this is beneficial in some people with tinnitus. Also discussed with him concerning using ear protection when around loud noise as this can make tinnitus worse. He will follow-up if he notices any worsening of his tinnitus or change in his  hearing.   Radene Journey, MD   CC:

## 2019-05-30 ENCOUNTER — Encounter (INDEPENDENT_AMBULATORY_CARE_PROVIDER_SITE_OTHER): Payer: Self-pay

## 2020-05-03 ENCOUNTER — Ambulatory Visit (INDEPENDENT_AMBULATORY_CARE_PROVIDER_SITE_OTHER): Payer: 59 | Admitting: Family Medicine

## 2020-05-03 ENCOUNTER — Encounter: Payer: Self-pay | Admitting: Family Medicine

## 2020-05-03 ENCOUNTER — Other Ambulatory Visit: Payer: Self-pay

## 2020-05-03 VITALS — BP 102/72 | HR 64 | Temp 98.0°F | Ht 71.0 in | Wt 172.6 lb

## 2020-05-03 DIAGNOSIS — Z0000 Encounter for general adult medical examination without abnormal findings: Secondary | ICD-10-CM | POA: Diagnosis not present

## 2020-05-03 LAB — CBC WITH DIFFERENTIAL/PLATELET
Basophils Absolute: 0 10*3/uL (ref 0.0–0.1)
Basophils Relative: 0.5 % (ref 0.0–3.0)
Eosinophils Absolute: 0.1 10*3/uL (ref 0.0–0.7)
Eosinophils Relative: 2.8 % (ref 0.0–5.0)
HCT: 46.9 % (ref 39.0–52.0)
Hemoglobin: 15.8 g/dL (ref 13.0–17.0)
Lymphocytes Relative: 33.8 % (ref 12.0–46.0)
Lymphs Abs: 1.7 10*3/uL (ref 0.7–4.0)
MCHC: 33.7 g/dL (ref 30.0–36.0)
MCV: 90.5 fl (ref 78.0–100.0)
Monocytes Absolute: 0.4 10*3/uL (ref 0.1–1.0)
Monocytes Relative: 8.3 % (ref 3.0–12.0)
Neutro Abs: 2.8 10*3/uL (ref 1.4–7.7)
Neutrophils Relative %: 54.6 % (ref 43.0–77.0)
Platelets: 181 10*3/uL (ref 150.0–400.0)
RBC: 5.18 Mil/uL (ref 4.22–5.81)
RDW: 12.8 % (ref 11.5–15.5)
WBC: 5.1 10*3/uL (ref 4.0–10.5)

## 2020-05-03 LAB — BASIC METABOLIC PANEL
BUN: 21 mg/dL (ref 6–23)
CO2: 29 mEq/L (ref 19–32)
Calcium: 9.6 mg/dL (ref 8.4–10.5)
Chloride: 102 mEq/L (ref 96–112)
Creatinine, Ser: 0.95 mg/dL (ref 0.40–1.50)
GFR: 96 mL/min (ref >60.00)
Glucose, Bld: 89 mg/dL (ref 70–99)
Potassium: 4.6 mEq/L (ref 3.5–5.1)
Sodium: 137 mEq/L (ref 135–145)

## 2020-05-03 LAB — HEPATIC FUNCTION PANEL
ALT: 22 U/L (ref 0–53)
AST: 20 U/L (ref 0–37)
Albumin: 4.4 g/dL (ref 3.5–5.2)
Alkaline Phosphatase: 44 U/L (ref 39–117)
Bilirubin, Direct: 0.1 mg/dL (ref 0.0–0.3)
Total Bilirubin: 0.7 mg/dL (ref 0.2–1.2)
Total Protein: 7.2 g/dL (ref 6.0–8.3)

## 2020-05-03 LAB — LIPID PANEL
Cholesterol: 201 mg/dL — ABNORMAL HIGH (ref 0–200)
HDL: 70.1 mg/dL (ref >39.00)
LDL Cholesterol: 116 mg/dL — ABNORMAL HIGH (ref 0–99)
NonHDL: 131.05
Total CHOL/HDL Ratio: 3
Triglycerides: 74 mg/dL (ref 0.0–149.0)
VLDL: 14.8 mg/dL (ref 0.0–40.0)

## 2020-05-03 LAB — TSH: TSH: 1.43 u[IU]/mL (ref 0.35–4.50)

## 2020-05-03 NOTE — Progress Notes (Signed)
   Subjective:    Patient ID: Marcus Bell, male    DOB: 07/19/1975, 45 y.o.   MRN: [id number]  HPI Here for a well exam. He feels fine. He is training to run another marathon in [place] later this year.   Review of Systems  Constitutional: Negative.   HENT: Negative.   Eyes: Negative.   Respiratory: Negative.   Cardiovascular: Negative.   Gastrointestinal: Negative.   Genitourinary: Negative.   Musculoskeletal: Negative.   Skin: Negative.   Neurological: Negative.   Psychiatric/Behavioral: Negative.        Objective:   Physical Exam Constitutional:      General: He is not in acute distress.    Appearance: He is well-developed. He is not diaphoretic.  HENT:     Head: Normocephalic and atraumatic.     Right Ear: External ear normal.     Left Ear: External ear normal.     Nose: Nose normal.     Mouth/Throat:     Pharynx: No oropharyngeal exudate.  Eyes:     General: No scleral icterus.       Right eye: No discharge.        Left eye: No discharge.     Conjunctiva/sclera: Conjunctivae normal.     Pupils: Pupils are equal, round, and reactive to light.  Neck:     Thyroid: No thyromegaly.     Vascular: No JVD.     Trachea: No tracheal deviation.  Cardiovascular:     Rate and Rhythm: Normal rate and regular rhythm.     Heart sounds: Normal heart sounds. No murmur heard. No friction rub. No gallop.   Pulmonary:     Effort: Pulmonary effort is normal. No respiratory distress.     Breath sounds: Normal breath sounds. No wheezing or rales.  Chest:     Chest wall: No tenderness.  Abdominal:     General: Bowel sounds are normal. There is no distension.     Palpations: Abdomen is soft. There is no mass.     Tenderness: There is no abdominal tenderness. There is no guarding or rebound.  Genitourinary:    Penis: Normal. No tenderness.      Testes: Normal.  Musculoskeletal:        General: No tenderness. Normal range of motion.     Cervical back: Neck supple.   Lymphadenopathy:     Cervical: No cervical adenopathy.  Skin:    General: Skin is warm and dry.     Coloration: Skin is not pale.     Findings: No erythema or rash.  Neurological:     Mental Status: He is alert and oriented to person, place, and time.     Cranial Nerves: No cranial nerve deficit.     Motor: No abnormal muscle tone.     Coordination: Coordination normal.     Deep Tendon Reflexes: Reflexes are normal and symmetric. Reflexes normal.  Psychiatric:        Behavior: Behavior normal.        Thought Content: Thought content normal.        Judgment: Judgment normal.           Assessment & Plan:  Well exam. We discussed diet and exercise. Get fasting labs.  Gershon Crane, MD

## 2020-05-03 NOTE — Addendum Note (Signed)
Addended by: Leonette Nutting on: 05/03/2020 10:45 AM   Modules accepted: Orders

## 2021-03-21 ENCOUNTER — Other Ambulatory Visit: Payer: Self-pay

## 2021-03-21 ENCOUNTER — Ambulatory Visit (INDEPENDENT_AMBULATORY_CARE_PROVIDER_SITE_OTHER): Payer: 59

## 2021-03-21 DIAGNOSIS — R002 Palpitations: Secondary | ICD-10-CM

## 2021-03-21 NOTE — Progress Notes (Unsigned)
Enrolled patient for a 14 day Zio XT  monitor to be mailed to patients home  °

## 2021-03-21 NOTE — Progress Notes (Signed)
Per Dr Graciela Husbands - pt needs 14day ZIO for exercise induced palpitations.  Order placed

## 2021-03-26 DIAGNOSIS — R002 Palpitations: Secondary | ICD-10-CM | POA: Diagnosis not present

## 2021-04-01 ENCOUNTER — Encounter: Payer: Self-pay | Admitting: Family Medicine

## 2021-04-22 ENCOUNTER — Telehealth: Payer: Self-pay

## 2021-04-22 ENCOUNTER — Encounter: Payer: Self-pay | Admitting: Internal Medicine

## 2021-04-22 DIAGNOSIS — E785 Hyperlipidemia, unspecified: Secondary | ICD-10-CM

## 2021-04-22 NOTE — Addendum Note (Signed)
Addended by: Alois Cliche on: 04/22/2021 02:21 PM   Modules accepted: Orders

## 2021-04-22 NOTE — Telephone Encounter (Signed)
Attempted phone call to pt and left voicemail message that he may contact RN at [phone] and that RN will also send his monitor results and recommendations through his MyChart.

## 2021-05-05 ENCOUNTER — Ambulatory Visit (INDEPENDENT_AMBULATORY_CARE_PROVIDER_SITE_OTHER): Payer: 59 | Admitting: Family Medicine

## 2021-05-05 ENCOUNTER — Encounter: Payer: Self-pay | Admitting: Family Medicine

## 2021-05-05 VITALS — BP 110/76 | HR 66 | Temp 98.1°F | Ht 71.0 in | Wt 179.0 lb

## 2021-05-05 DIAGNOSIS — Z0000 Encounter for general adult medical examination without abnormal findings: Secondary | ICD-10-CM | POA: Diagnosis not present

## 2021-05-05 LAB — LIPID PANEL
Cholesterol: 207 mg/dL — ABNORMAL HIGH (ref 0–200)
HDL: 70.8 mg/dL (ref >39.00)
LDL Cholesterol: 121 mg/dL — ABNORMAL HIGH (ref 0–99)
NonHDL: 136.48
Total CHOL/HDL Ratio: 3
Triglycerides: 77 mg/dL (ref 0.0–149.0)
VLDL: 15.4 mg/dL (ref 0.0–40.0)

## 2021-05-05 LAB — BASIC METABOLIC PANEL
BUN: 16 mg/dL (ref 6–23)
CO2: 30 mEq/L (ref 19–32)
Calcium: 9.7 mg/dL (ref 8.4–10.5)
Chloride: 102 mEq/L (ref 96–112)
Creatinine, Ser: 0.92 mg/dL (ref 0.40–1.50)
GFR: 99.06 mL/min (ref >60.00)
Glucose, Bld: 91 mg/dL (ref 70–99)
Potassium: 4.1 mEq/L (ref 3.5–5.1)
Sodium: 138 mEq/L (ref 135–145)

## 2021-05-05 LAB — CBC WITH DIFFERENTIAL/PLATELET
Basophils Absolute: 0 10*3/uL (ref 0.0–0.1)
Basophils Relative: 0.5 % (ref 0.0–3.0)
Eosinophils Absolute: 0.2 10*3/uL (ref 0.0–0.7)
Eosinophils Relative: 3 % (ref 0.0–5.0)
HCT: 46.9 % (ref 39.0–52.0)
Hemoglobin: 16.1 g/dL (ref 13.0–17.0)
Lymphocytes Relative: 29 % (ref 12.0–46.0)
Lymphs Abs: 1.4 10*3/uL (ref 0.7–4.0)
MCHC: 34.4 g/dL (ref 30.0–36.0)
MCV: 89.8 fl (ref 78.0–100.0)
Monocytes Absolute: 0.4 10*3/uL (ref 0.1–1.0)
Monocytes Relative: 7.4 % (ref 3.0–12.0)
Neutro Abs: 3 10*3/uL (ref 1.4–7.7)
Neutrophils Relative %: 60.1 % (ref 43.0–77.0)
Platelets: 190 10*3/uL (ref 150.0–400.0)
RBC: 5.23 Mil/uL (ref 4.22–5.81)
RDW: 12.8 % (ref 11.5–15.5)
WBC: 5 10*3/uL (ref 4.0–10.5)

## 2021-05-05 LAB — HEPATIC FUNCTION PANEL
ALT: 19 U/L (ref 0–53)
AST: 22 U/L (ref 0–37)
Albumin: 4.8 g/dL (ref 3.5–5.2)
Alkaline Phosphatase: 43 U/L (ref 39–117)
Bilirubin, Direct: 0.1 mg/dL (ref 0.0–0.3)
Total Bilirubin: 0.7 mg/dL (ref 0.2–1.2)
Total Protein: 7.3 g/dL (ref 6.0–8.3)

## 2021-05-05 LAB — HEMOGLOBIN A1C: Hgb A1c MFr Bld: 5.6 % (ref 4.6–6.5)

## 2021-05-05 LAB — TSH: TSH: 1.8 u[IU]/mL (ref 0.35–5.50)

## 2021-05-05 NOTE — Progress Notes (Signed)
? ?  Subjective:  ? ? Patient ID: Marcus Bell, male    DOB: Apr 17, 1975, 46 y.o.   MRN: [id number] ? ?HPI ?Here for a well exam. He feels fine. He still runs 3-5 days a week.  ? ? ?Review of Systems  ?Constitutional: Negative.   ?HENT: Negative.    ?Eyes: Negative.   ?Respiratory: Negative.    ?Cardiovascular: Negative.   ?Gastrointestinal: Negative.   ?Genitourinary: Negative.   ?Musculoskeletal: Negative.   ?Skin: Negative.   ?Neurological: Negative.   ?Psychiatric/Behavioral: Negative.    ? ?   ?Objective:  ? Physical Exam ?Constitutional:   ?   General: He is not in acute distress. ?   Appearance: Normal appearance. He is well-developed. He is not diaphoretic.  ?HENT:  ?   Head: Normocephalic and atraumatic.  ?   Right Ear: External ear normal.  ?   Left Ear: External ear normal.  ?   Nose: Nose normal.  ?   Mouth/Throat:  ?   Pharynx: No oropharyngeal exudate.  ?Eyes:  ?   General: No scleral icterus.    ?   Right eye: No discharge.     ?   Left eye: No discharge.  ?   Conjunctiva/sclera: Conjunctivae normal.  ?   Pupils: Pupils are equal, round, and reactive to light.  ?Neck:  ?   Thyroid: No thyromegaly.  ?   Vascular: No JVD.  ?   Trachea: No tracheal deviation.  ?Cardiovascular:  ?   Rate and Rhythm: Normal rate and regular rhythm.  ?   Heart sounds: Normal heart sounds. No murmur heard. ?  No friction rub. No gallop.  ?Pulmonary:  ?   Effort: Pulmonary effort is normal. No respiratory distress.  ?   Breath sounds: Normal breath sounds. No wheezing or rales.  ?Chest:  ?   Chest wall: No tenderness.  ?Abdominal:  ?   General: Bowel sounds are normal. There is no distension.  ?   Palpations: Abdomen is soft. There is no mass.  ?   Tenderness: There is no abdominal tenderness. There is no guarding or rebound.  ?Genitourinary: ?   Penis: Normal. No tenderness.   ?   Testes: Normal.  ?Musculoskeletal:     ?   General: No tenderness. Normal range of motion.  ?   Cervical back: Neck supple.   ?Lymphadenopathy:  ?   Cervical: No cervical adenopathy.  ?Skin: ?   General: Skin is warm and dry.  ?   Coloration: Skin is not pale.  ?   Findings: No erythema or rash.  ?Neurological:  ?   Mental Status: He is alert and oriented to person, place, and time.  ?   Cranial Nerves: No cranial nerve deficit.  ?   Motor: No abnormal muscle tone.  ?   Coordination: Coordination normal.  ?   Deep Tendon Reflexes: Reflexes are normal and symmetric. Reflexes normal.  ?Psychiatric:     ?   Behavior: Behavior normal.     ?   Thought Content: Thought content normal.     ?   Judgment: Judgment normal.  ? ? ? ? ? ?   ?Assessment & Plan:  ?Well exam. We discussed diet and exercise. Get fasting labs. Per his request, we will set up a colonoscopy.  ?Alysia Penna, MD ? ? ?

## 2021-05-08 ENCOUNTER — Encounter: Payer: Self-pay | Admitting: Family Medicine

## 2021-06-04 ENCOUNTER — Ambulatory Visit
Admission: RE | Admit: 2021-06-04 | Discharge: 2021-06-04 | Disposition: A | Discharge status: Home / Self Care | Payer: Self-pay | Source: Ambulatory Visit | Attending: Internal Medicine | Admitting: Internal Medicine

## 2021-06-04 DIAGNOSIS — E785 Hyperlipidemia, unspecified: Secondary | ICD-10-CM

## 2021-06-06 ENCOUNTER — Ambulatory Visit: Payer: 59 | Admitting: Internal Medicine

## 2021-06-06 ENCOUNTER — Encounter: Payer: Self-pay | Admitting: Internal Medicine

## 2021-06-06 VITALS — BP 112/84 | HR 59 | Ht 71.0 in | Wt 177.6 lb

## 2021-06-06 DIAGNOSIS — R002 Palpitations: Secondary | ICD-10-CM
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
## 2021-06-06 NOTE — Patient Instructions (Signed)
Medication Instructions:  Your physician recommends that you continue on your current medications as directed. Please refer to the Current Medication list given to you today.  *If you need a refill on your cardiac medications before your next appointment, please call your pharmacy*   Lab Work: None ordered.  If you have labs (blood work) drawn today and your tests are completely normal, you will receive your results only by: MyChart Message (if you have MyChart) OR A paper copy in the mail If you have any lab test that is abnormal or we need to change your treatment, we will call you to review the results.   Testing/Procedures: None ordered.    Follow-Up: At [facility], you and your health needs are our priority.  As part of our continuing mission to provide you with exceptional heart care, we have created designated Provider Care Teams.  These Care Teams include your primary Cardiologist (physician) and Advanced Practice Providers (APPs -  Physician Assistants and Nurse Practitioners) who all work together to provide you with the care you need, when you need it.  We recommend signing up for the patient portal called "MyChart".  Sign up information is provided on this After Visit Summary.  MyChart is used to connect with patients for Virtual Visits (Telemedicine).  Patients are able to view lab/test results, encounter notes, upcoming appointments, etc.  Non-urgent messages can be sent to your provider as well.   To learn more about what you can do with MyChart, go to [email or web address].    Your next appointment:   Follow up with Dr Klein as needed  Important Information About Sugar       

## 2021-07-01 ENCOUNTER — Encounter: Payer: Self-pay | Admitting: Family Medicine

## 2021-07-02 ENCOUNTER — Other Ambulatory Visit: Payer: Self-pay

## 2021-07-02 MED ORDER — DOXYCYCLINE HYCLATE 100 MG PO TABS: 100.0000 mg | ORAL_TABLET | Freq: Two times a day (BID) | ORAL | 0 refills | Status: DC

## 2021-07-02 NOTE — Telephone Encounter (Signed)
This will probably be okay, but since he is leaving the country, we should cover for any possible tick bourne illness. Call in Doxycycline 100 mg BID for 10 days  ?

## 2021-07-18 ENCOUNTER — Other Ambulatory Visit: Payer: Self-pay

## 2021-07-18 MED ORDER — CIPROFLOXACIN HCL 500 MG PO TABS: 500.0000 mg | ORAL_TABLET | Freq: Two times a day (BID) | ORAL | 0 refills | Status: DC

## 2021-07-18 NOTE — Telephone Encounter (Signed)
Let's try treating this with a different antibiotic. Call in Cipro 500 mg BID for 7 days. If he is not well after that, make an OV

## 2022-01-29 DIAGNOSIS — M79604 Pain in right leg: Secondary | ICD-10-CM | POA: Diagnosis not present

## 2022-01-29 DIAGNOSIS — M79671 Pain in right foot: Secondary | ICD-10-CM | POA: Diagnosis not present

## 2022-02-10 DIAGNOSIS — F606 Avoidant personality disorder: Secondary | ICD-10-CM | POA: Diagnosis not present

## 2022-02-24 DIAGNOSIS — S8392XA Sprain of unspecified site of left knee, initial encounter: Secondary | ICD-10-CM | POA: Diagnosis not present

## 2022-03-09 DIAGNOSIS — F606 Avoidant personality disorder: Secondary | ICD-10-CM | POA: Diagnosis not present

## 2022-03-16 DIAGNOSIS — F606 Avoidant personality disorder: Secondary | ICD-10-CM | POA: Diagnosis not present

## 2022-04-09 DIAGNOSIS — M79604 Pain in right leg: Secondary | ICD-10-CM | POA: Diagnosis not present

## 2022-04-14 DIAGNOSIS — H43393 Other vitreous opacities, bilateral: Secondary | ICD-10-CM | POA: Diagnosis not present

## 2022-04-27 DIAGNOSIS — F606 Avoidant personality disorder: Secondary | ICD-10-CM | POA: Diagnosis not present

## 2022-07-27 DIAGNOSIS — F606 Avoidant personality disorder: Secondary | ICD-10-CM | POA: Diagnosis not present

## 2022-08-10 DIAGNOSIS — F606 Avoidant personality disorder: Secondary | ICD-10-CM | POA: Diagnosis not present

## 2022-08-24 DIAGNOSIS — F606 Avoidant personality disorder: Secondary | ICD-10-CM | POA: Diagnosis not present

## 2022-08-26 ENCOUNTER — Ambulatory Visit: Payer: BC Managed Care – PPO | Admitting: Family Medicine

## 2022-08-26 ENCOUNTER — Encounter: Payer: Self-pay | Admitting: Family Medicine

## 2022-08-26 VITALS — BP 110/78 | HR 50 | Temp 98.4°F | Ht 72.0 in | Wt 165.0 lb

## 2022-08-26 DIAGNOSIS — Z0000 Encounter for general adult medical examination without abnormal findings: Secondary | ICD-10-CM | POA: Diagnosis not present

## 2022-08-26 LAB — BASIC METABOLIC PANEL
BUN: 18 mg/dL (ref 6–23)
CO2: 31 mEq/L (ref 19–32)
Calcium: 10.1 mg/dL (ref 8.4–10.5)
Chloride: 101 mEq/L (ref 96–112)
Creatinine, Ser: 0.9 mg/dL (ref 0.40–1.50)
GFR: 100.78 mL/min (ref >60.00)
Glucose, Bld: 90 mg/dL (ref 70–99)
Potassium: 4.4 mEq/L (ref 3.5–5.1)
Sodium: 138 mEq/L (ref 135–145)

## 2022-08-26 LAB — CBC WITH DIFFERENTIAL/PLATELET
Basophils Absolute: 0 10*3/uL (ref 0.0–0.1)
Basophils Relative: 0.7 % (ref 0.0–3.0)
Eosinophils Absolute: 0.1 10*3/uL (ref 0.0–0.7)
Eosinophils Relative: 2.1 % (ref 0.0–5.0)
HCT: 46.8 % (ref 39.0–52.0)
Hemoglobin: 15.6 g/dL (ref 13.0–17.0)
Lymphocytes Relative: 34.4 % (ref 12.0–46.0)
Lymphs Abs: 1.9 10*3/uL (ref 0.7–4.0)
MCHC: 33.3 g/dL (ref 30.0–36.0)
MCV: 91.5 fl (ref 78.0–100.0)
Monocytes Absolute: 0.4 10*3/uL (ref 0.1–1.0)
Monocytes Relative: 8 % (ref 3.0–12.0)
Neutro Abs: 3 10*3/uL (ref 1.4–7.7)
Neutrophils Relative %: 54.8 % (ref 43.0–77.0)
Platelets: 206 10*3/uL (ref 150.0–400.0)
RBC: 5.12 Mil/uL (ref 4.22–5.81)
RDW: 13.1 % (ref 11.5–15.5)
WBC: 5.6 10*3/uL (ref 4.0–10.5)

## 2022-08-26 LAB — LIPID PANEL
Cholesterol: 225 mg/dL — ABNORMAL HIGH (ref 0–200)
HDL: 65 mg/dL (ref >39.00)
LDL Cholesterol: 147 mg/dL — ABNORMAL HIGH (ref 0–99)
NonHDL: 159.57
Total CHOL/HDL Ratio: 3
Triglycerides: 62 mg/dL (ref 0.0–149.0)
VLDL: 12.4 mg/dL (ref 0.0–40.0)

## 2022-08-26 LAB — TSH: TSH: 1.76 u[IU]/mL (ref 0.35–5.50)

## 2022-08-26 LAB — HEMOGLOBIN A1C: Hgb A1c MFr Bld: 5.6 % (ref 4.6–6.5)

## 2022-08-26 LAB — HEPATIC FUNCTION PANEL
ALT: 19 U/L (ref 0–53)
AST: 22 U/L (ref 0–37)
Albumin: 4.7 g/dL (ref 3.5–5.2)
Alkaline Phosphatase: 42 U/L (ref 39–117)
Bilirubin, Direct: 0.2 mg/dL (ref 0.0–0.3)
Total Bilirubin: 1 mg/dL (ref 0.2–1.2)
Total Protein: 7.7 g/dL (ref 6.0–8.3)

## 2022-08-26 NOTE — Progress Notes (Signed)
Subjective:    Patient ID: Marcus Bell, male    DOB: [birth year], 47 y.o.   MRN: [id number]  HPI Here for a well exam. He feels great. He still runs 6 days a week., and he recently completed a marathon.    Review of Systems  Constitutional: Negative.   HENT: Negative.    Eyes: Negative.   Respiratory: Negative.    Cardiovascular: Negative.   Gastrointestinal: Negative.   Genitourinary: Negative.   Musculoskeletal: Negative.   Skin: Negative.   Neurological: Negative.   Psychiatric/Behavioral: Negative.         Objective:   Physical Exam Constitutional:      General: He is not in acute distress.    Appearance: Normal appearance. He is well-developed. He is not diaphoretic.  HENT:     Head: Normocephalic and atraumatic.     Right Ear: External ear normal.     Left Ear: External ear normal.     Nose: Nose normal.     Mouth/Throat:     Pharynx: No oropharyngeal exudate.  Eyes:     General: No scleral icterus.       Right eye: No discharge.        Left eye: No discharge.     Conjunctiva/sclera: Conjunctivae normal.     Pupils: Pupils are equal, round, and reactive to light.  Neck:     Thyroid: No thyromegaly.     Vascular: No JVD.     Trachea: No tracheal deviation.  Cardiovascular:     Rate and Rhythm: Normal rate and regular rhythm.     Pulses: Normal pulses.     Heart sounds: Normal heart sounds. No murmur heard.    No friction rub. No gallop.  Pulmonary:     Effort: Pulmonary effort is normal. No respiratory distress.     Breath sounds: Normal breath sounds. No wheezing or rales.  Chest:     Chest wall: No tenderness.  Abdominal:     General: Bowel sounds are normal. There is no distension.     Palpations: Abdomen is soft. There is no mass.     Tenderness: There is no abdominal tenderness. There is no guarding or rebound.  Genitourinary:    Penis: Normal. No tenderness.      Testes: Normal.  Musculoskeletal:        General: No tenderness.  Normal range of motion.     Cervical back: Neck supple.  Lymphadenopathy:     Cervical: No cervical adenopathy.  Skin:    General: Skin is warm and dry.     Coloration: Skin is not pale.     Findings: No erythema or rash.  Neurological:     General: No focal deficit present.     Mental Status: He is alert and oriented to person, place, and time.     Cranial Nerves: No cranial nerve deficit.     Motor: No abnormal muscle tone.     Coordination: Coordination normal.     Deep Tendon Reflexes: Reflexes are normal and symmetric. Reflexes normal.  Psychiatric:        Mood and Affect: Mood normal.        Behavior: Behavior normal.        Thought Content: Thought content normal.        Judgment: Judgment normal.           Assessment & Plan:  Well exam. We discussed diet and exercise. Get fasting labs. Gershon Crane, MD

## 2022-08-26 NOTE — Addendum Note (Signed)
Addended by: Gershon Crane A on: 08/26/2022 02:27 PM   Modules accepted: Orders

## 2022-09-02 ENCOUNTER — Encounter: Payer: Self-pay | Admitting: Family Medicine

## 2022-09-02 NOTE — Telephone Encounter (Signed)
Restrict fatty foods like fried foods, gravy,  and bacon and sausage. Use low fat dairy products

## 2022-09-10 ENCOUNTER — Encounter: Payer: Self-pay | Admitting: Internal Medicine

## 2022-10-02 ENCOUNTER — Telehealth: Payer: Self-pay | Admitting: *Deleted

## 2022-10-02 ENCOUNTER — Ambulatory Visit: Payer: BC Managed Care – PPO

## 2022-10-02 NOTE — Progress Notes (Unsigned)
Pt's name and DOB verified at the beginning of the pre-visit.  Pt denies any difficulty with ambulating,sitting, laying down or rolling side to side Gave both [facility] main # and MD on call # prior to instructions.  No egg or soy allergy known to patient  No issues known to pt with past sedation with any surgeries or procedures Pt denies having issues being intubated Patient denies ever being intubated Pt has no issues moving head neck or swallowing No FH of Malignant Hyperthermia Pt is not on diet pills Pt is not on home 02  Pt is not on blood thinners  Pt denies issues with constipation  Pt has frequent issues with constipation RN instructed pt to use Miralax per bottles instructions a week before prep days. Pt states they will Pt is not on dialysis Pt denise any abnormal heart rhythms  Pt denies any upcoming cardiac testing Pt encouraged to use to use Singlecare or Goodrx to reduce cost  Patient's chart reviewed by John Nulty CNRA prior to pre-visit and patient appropriate for the [facility].  Pre-visit completed and red dot placed by patient's name on their procedure day (on provider's schedule).  . Visit by phone Pt states weight is  Instructed pt why it is important to and  to call if they have any changes in health or new medications. Directed them to the # given and on instructions.   Pt states they will.  Instructions reviewed with pt and pt states understanding. Instructed to review again prior to procedure. Pt states they will.  Instructions sent by mail with coupon and by my chart   

## 2022-10-02 NOTE — Telephone Encounter (Signed)
Reached pt to do pre-visit. Would not identify self and asked "who are you?" RN informed of who she was ad where she was calling from. Pt stated he did not have time to speak and what this was about. RN stated that it is an appointment set he refused to do appointment. RN to cancel procedure per pt stating to cancel.

## 2022-10-16 ENCOUNTER — Encounter: Payer: BC Managed Care – PPO | Admitting: Internal Medicine

## 2022-10-22 ENCOUNTER — Encounter: Payer: Self-pay | Admitting: Internal Medicine

## 2022-10-22 ENCOUNTER — Ambulatory Visit (AMBULATORY_SURGERY_CENTER): Payer: BC Managed Care – PPO | Admitting: *Deleted

## 2022-10-22 VITALS — Ht 72.0 in | Wt 165.0 lb

## 2022-10-22 DIAGNOSIS — Z1211 Encounter for screening for malignant neoplasm of colon: Secondary | ICD-10-CM

## 2022-10-22 MED ORDER — NA SULFATE-K SULFATE-MG SULF 17.5-3.13-1.6 GM/177ML PO SOLN
1.0000 | Freq: Once | ORAL | 0 refills | Status: AC
Start: 2022-10-22 — End: 2022-10-22

## 2022-10-22 NOTE — Progress Notes (Signed)
Pt's name and DOB verified at the beginning of the pre-visit.  Pt denies any difficulty with ambulating,sitting, laying down or rolling side to side Gave both [facility] main # and MD on call # prior to instructions.  No egg or soy allergy known to patient  Patient denies ever being intubated Pt has no issues moving head neck or swallowing No FH of Malignant Hyperthermia Pt is not on diet pills Pt is not on home 02  Pt is not on blood thinners  Pt denies issues with constipation  Pt is not on dialysis Pt denise any abnormal heart rhythms  Pt denies any upcoming cardiac testing Pt encouraged to use to use Singlecare or Goodrx to reduce cost  Patient's chart reviewed by Cathlyn Parsons CNRA prior to pre-visit and patient appropriate for the [facility].  Pre-visit completed and red dot placed by patient's name on their procedure day (on provider's schedule).  . Visit by phone Pt states weight is 165 lb Instructed pt why it is important to and  to call if they have any changes in health or new medications. Directed them to the # given and on instructions.   Pt states they will.  Instructions reviewed with pt and pt states understanding. Instructed to review again prior to procedure. Pt states they will.  Instructions sent by mail with coupon and by my chart

## 2022-11-02 ENCOUNTER — Encounter: Payer: Self-pay | Admitting: Family Medicine

## 2022-11-07 ENCOUNTER — Encounter: Payer: Self-pay | Admitting: Certified Registered Nurse Anesthetist

## 2022-11-12 ENCOUNTER — Encounter: Payer: Self-pay | Admitting: Internal Medicine

## 2022-11-12 ENCOUNTER — Ambulatory Visit (AMBULATORY_SURGERY_CENTER): Payer: BC Managed Care – PPO | Admitting: Internal Medicine

## 2022-11-12 VITALS — BP 110/65 | HR 53 | Temp 97.7°F | Resp 10 | Ht 72.0 in | Wt 165.0 lb

## 2022-11-12 DIAGNOSIS — Z1211 Encounter for screening for malignant neoplasm of colon: Secondary | ICD-10-CM

## 2022-11-12 DIAGNOSIS — K635 Polyp of colon: Secondary | ICD-10-CM | POA: Diagnosis not present

## 2022-11-12 DIAGNOSIS — D122 Benign neoplasm of ascending colon: Secondary | ICD-10-CM

## 2022-11-12 HISTORY — PX: COLONOSCOPY: SHX174

## 2022-11-12 MED ORDER — SODIUM CHLORIDE 0.9 % IV SOLN: 500.0000 mL | Freq: Once | INTRAVENOUS | Status: DC

## 2022-11-12 NOTE — Progress Notes (Signed)
Sedate, gd SR, tolerated procedure well, VSS, report to RN 

## 2022-11-12 NOTE — Progress Notes (Signed)
Called to room to assist during endoscopic procedure.  Patient ID and intended procedure confirmed with present staff. Received instructions for my participation in the procedure from the performing physician.  

## 2022-11-12 NOTE — Op Note (Signed)
[facility] Patient Name: Marcus Bell Procedure Date: 11/12/2022 2:29 PM MRN: [id number] Endoscopist: Madelyn Brunner [place] , , [id number] Age: 47 Referring MD:  Date of Birth: Dec 21, 1975 Gender: Male Account #: [id number] Procedure:                Colonoscopy Indications:              Screening for colorectal malignant neoplasm, This                            is the patient's first colonoscopy Medicines:                Monitored Anesthesia Care Procedure:                Pre-Anesthesia Assessment:                           - Prior to the procedure, a History and Physical                            was performed, and patient medications and                            allergies were reviewed. The patient's tolerance of                            previous anesthesia was also reviewed. The risks                            and benefits of the procedure and the sedation                            options and risks were discussed with the patient.                            All questions were answered, and informed consent                            was obtained. Prior Anticoagulants: The patient has                            taken no anticoagulant or antiplatelet agents. ASA                            Grade Assessment: I - A normal, healthy patient.                            After reviewing the risks and benefits, the patient                            was deemed in satisfactory condition to undergo the                            procedure.  After obtaining informed consent, the colonoscope                            was passed under direct vision. Throughout the                            procedure, the patient's blood pressure, pulse, and                            oxygen saturations were monitored continuously. The                            Olympus Scope SN: [id number] was introduced through                            the anus and advanced to the  the terminal ileum.                            The colonoscopy was performed without difficulty.                            The patient tolerated the procedure well. The                            quality of the bowel preparation was excellent. The                            terminal ileum, ileocecal valve, appendiceal                            orifice, and rectum were photographed. Scope In: 2:42:21 PM Scope Out: 2:58:32 PM Scope Withdrawal Time: 0 hours 14 minutes 5 seconds  Total Procedure Duration: 0 hours 16 minutes 11 seconds  Findings:                 The terminal ileum appeared normal.                           Two sessile polyps were found in the ascending                            colon. The polyps were 3 to 6 mm in size. These                            polyps were removed with a cold snare. Resection                            and retrieval were complete.                           Non-bleeding internal hemorrhoids were found during                            retroflexion. Complications:  No immediate complications. Estimated Blood Loss:     Estimated blood loss was minimal. Impression:               - The examined portion of the ileum was normal.                           - Two 3 to 6 mm polyps in the ascending colon,                            removed with a cold snare. Resected and retrieved.                           - Non-bleeding internal hemorrhoids. Recommendation:           - Discharge patient to home (with escort).                           - Await pathology results.                           - The findings and recommendations were discussed                            with the patient. Dr Particia Lather "[name]" [name],  11/12/2022 3:08:02 PM

## 2022-11-12 NOTE — Progress Notes (Signed)
Pt's states no medical or surgical changes since previsit or office visit. 

## 2022-11-12 NOTE — Patient Instructions (Signed)

## 2022-11-12 NOTE — Progress Notes (Signed)
   GASTROENTEROLOGY PROCEDURE H&P NOTE   Primary Care Physician: Nelwyn Salisbury, MD    Reason for Procedure:   Colon cancer screening  Plan:    Colonoscopy  Patient is appropriate for endoscopic procedure(s) in the ambulatory ([facility]) setting.  The nature of the procedure, as well as the risks, benefits, and alternatives were carefully and thoroughly reviewed with the patient. Ample time for discussion and questions allowed. The patient understood, was satisfied, and agreed to proceed.     HPI: Marcus Bell is a 47 y.o. male who presents for colonoscopy for colon cancer screening. Denies blood in stools, changes in bowel habits, or unintentional weight loss. Denies family history of colon cancer.  Past Medical History:  Diagnosis Date   Acetabular labrum tear 2019   Allergy     Past Surgical History:  Procedure Laterality Date   WISDOM TOOTH EXTRACTION      Prior to Admission medications   Not on File    No current outpatient medications on file.   Current Facility-Administered Medications  Medication Dose Route Frequency Provider Last Rate Last Admin   0.9 %  sodium chloride infusion  500 mL Intravenous Once Imogene Burn, MD        Allergies as of 11/12/2022   (No Known Allergies)    Family History  Problem Relation Age of Onset   Cancer Father    Charcot-Marie-Tooth disease Father    Prostate cancer Father    Charcot-Marie-Tooth disease Sister    Asthma Daughter    Colon polyps Neg Hx    Colon cancer Neg Hx    Esophageal cancer Neg Hx    Rectal cancer Neg Hx    Stomach cancer Neg Hx     Social History   Socioeconomic History   Marital status: Married    Spouse name: Not on file   Number of children: Not on file   Years of education: Not on file   Highest education level: Not on file  Occupational History   Not on file  Tobacco Use   Smoking status: Never   Smokeless tobacco: Never  Substance and Sexual Activity   Alcohol use: Yes     Comment: 4 beers a week   Drug use: Never   Sexual activity: Not on file  Other Topics Concern   Not on file  Social History Narrative   Not on file   Social Determinants of Health   Financial Resource Strain: Not on file  Food Insecurity: Not on file  Transportation Needs: Not on file  Physical Activity: Not on file  Stress: Not on file  Social Connections: Not on file  Intimate Partner Violence: Not on file    Physical Exam: Vital signs in last 24 hours: BP (!) 95/59   Pulse 64   Temp 97.7 F (36.5 C) (Temporal)   Ht 6' (1.829 m)   Wt 165 lb (74.8 kg)   SpO2 97%   BMI 22.38 kg/m  GEN: NAD EYE: Sclerae anicteric ENT: MMM CV: Non-tachycardic Pulm: No increased work of breathing GI: Soft, NT/ND NEURO:  Alert & Oriented   Eulah Pont, MD [facility]  11/12/2022 1:18 PM

## 2022-11-16 ENCOUNTER — Telehealth: Payer: Self-pay

## 2022-11-16 NOTE — Telephone Encounter (Signed)
Attempted f/u call. No answer, left VM. 

## 2022-11-18 ENCOUNTER — Encounter: Payer: Self-pay | Admitting: Internal Medicine

## 2022-11-18 LAB — SURGICAL PATHOLOGY

## 2022-11-19 DIAGNOSIS — M79604 Pain in right leg: Secondary | ICD-10-CM | POA: Diagnosis not present

## 2022-11-25 DIAGNOSIS — F606 Avoidant personality disorder: Secondary | ICD-10-CM | POA: Diagnosis not present

## 2022-11-28 DIAGNOSIS — M79604 Pain in right leg: Secondary | ICD-10-CM | POA: Diagnosis not present

## 2022-12-04 DIAGNOSIS — M79604 Pain in right leg: Secondary | ICD-10-CM | POA: Diagnosis not present

## 2022-12-09 DIAGNOSIS — F606 Avoidant personality disorder: Secondary | ICD-10-CM | POA: Diagnosis not present

## 2022-12-23 DIAGNOSIS — F606 Avoidant personality disorder: Secondary | ICD-10-CM | POA: Diagnosis not present

## 2023-01-06 DIAGNOSIS — F606 Avoidant personality disorder: Secondary | ICD-10-CM | POA: Diagnosis not present

## 2023-01-27 DIAGNOSIS — F606 Avoidant personality disorder: Secondary | ICD-10-CM | POA: Diagnosis not present

## 2023-02-03 DIAGNOSIS — F606 Avoidant personality disorder: Secondary | ICD-10-CM | POA: Diagnosis not present

## 2023-02-09 DIAGNOSIS — F606 Avoidant personality disorder: Secondary | ICD-10-CM | POA: Diagnosis not present

## 2023-02-25 DIAGNOSIS — M25571 Pain in right ankle and joints of right foot: Secondary | ICD-10-CM | POA: Diagnosis not present

## 2023-02-25 DIAGNOSIS — M79604 Pain in right leg: Secondary | ICD-10-CM | POA: Diagnosis not present

## 2023-03-02 ENCOUNTER — Ambulatory Visit: Payer: Self-pay | Admitting: Family Medicine

## 2023-03-02 ENCOUNTER — Encounter: Payer: Self-pay | Admitting: Family Medicine

## 2023-03-02 DIAGNOSIS — F606 Avoidant personality disorder: Secondary | ICD-10-CM | POA: Diagnosis not present

## 2023-03-02 NOTE — Telephone Encounter (Signed)
   Chief Complaint: fatigue Symptoms: tired Frequency: constant  Disposition: [] ED /[] Urgent Care (no appt availability in office) / [x] Appointment(In office/virtual)/ []  [facility]/ [] Home Care/ [] Refused Recommended Disposition /[] [facility]/ []  Follow-up with PCP Additional Notes: Pt complaining of unusual fatigue. Pt states, I just don't feel like myself. Something feels off. I can't pinpoint it. I thought it might be my job. It's been really stressful. I see Dr Johnny on 1/13 for my wellness visit and thought it would be a good idea for labs to be done prior to appt. Pt is avid runner and was out for nearly a month due to calf injury. Just started back this week. Per protocol pt to be seen within 3 days. Appt made with Dr Micheal to address concerns and possibly order labs. Pt verbalized all care advice.         Copied from CRM [id number]. Topic: Clinical - Pink Word Triage >> Mar 02, 2023  4:20 PM Burnard DEL wrote: Reason for Triage: feel off, may have low blood sugar,fatigued Reason for Disposition  [1] MILD weakness (i.e., does not interfere with ability to work, go to school, normal activities) AND [2] persists > 1 week  Answer Assessment - Initial Assessment Questions 1. DESCRIPTION: Describe how you are feeling.     Tired, low energy  2. SEVERITY: How bad is it?  Can you stand and walk?   - MILD (0-3): Feels weak or tired, but does not interfere with work, school or normal activities.   - MODERATE (4-7): Able to stand and walk; weakness interferes with work, school, or normal activities.   - SEVERE (8-10): Unable to stand or walk; unable to do usual activities.     Gets all it done  3. ONSET: When did these symptoms begin? (e.g., hours, days, weeks, months) 12/2022 4. CAUSE: What do you think is causing the weakness or fatigue? (e.g., not drinking enough fluids, medical problem, trouble sleeping)     Not sure-possibly work related  5. NEW  MEDICINES:  Have you started on any new medicines recently? (e.g., opioid pain medicines, benzodiazepines, muscle relaxants, antidepressants, antihistamines, neuroleptics, beta blockers)    Takes melatonin some nights 6. OTHER SYMPTOMS: Do you have any other symptoms? (e.g., chest pain, fever, cough, SOB, vomiting, diarrhea, bleeding, other areas of pain)     Denies  Protocols used: Weakness (Generalized) and Fatigue-A-AH

## 2023-03-02 NOTE — Telephone Encounter (Signed)
 FYI Pt has appointment scheduled with Dr Caryl Never on 03/05/23

## 2023-03-05 ENCOUNTER — Ambulatory Visit: Payer: BC Managed Care – PPO | Admitting: Family Medicine

## 2023-03-08 ENCOUNTER — Ambulatory Visit: Payer: BC Managed Care – PPO | Admitting: Family Medicine

## 2023-03-09 DIAGNOSIS — F606 Avoidant personality disorder: Secondary | ICD-10-CM | POA: Diagnosis not present

## 2023-03-11 DIAGNOSIS — M25571 Pain in right ankle and joints of right foot: Secondary | ICD-10-CM | POA: Diagnosis not present

## 2023-03-11 DIAGNOSIS — M79604 Pain in right leg: Secondary | ICD-10-CM | POA: Diagnosis not present

## 2023-03-17 DIAGNOSIS — F606 Avoidant personality disorder: Secondary | ICD-10-CM | POA: Diagnosis not present

## 2023-03-19 DIAGNOSIS — M25571 Pain in right ankle and joints of right foot: Secondary | ICD-10-CM | POA: Diagnosis not present

## 2023-03-19 DIAGNOSIS — M79604 Pain in right leg: Secondary | ICD-10-CM | POA: Diagnosis not present

## 2023-03-23 DIAGNOSIS — F606 Avoidant personality disorder: Secondary | ICD-10-CM | POA: Diagnosis not present

## 2023-04-20 DIAGNOSIS — F606 Avoidant personality disorder: Secondary | ICD-10-CM | POA: Diagnosis not present

## 2023-09-10 ENCOUNTER — Encounter: Payer: Self-pay | Admitting: Advanced Practice Midwife

## 2023-10-02 IMAGING — CT CT CARDIAC CORONARY ARTERY CALCIUM SCORE
3 series · 14 of 20 positions shown, 16 images · non-contrast
Comparison: None.
COMPARISON: None.

Addendum:
EXAM:
OVER-READ INTERPRETATION  CT CHEST

The following report is an over-read performed by radiologist Dr.
does not include interpretation of cardiac or coronary anatomy or
pathology. The coronary calcium score interpretation by the
cardiologist is attached.
TECHNIQUE: A gated, non-contrast computed tomography scan of the heart was
performed using 3mm slice thickness. Axial images were analyzed on a
dedicated workstation. Calcium scoring of the coronary arteries was
performed using the Agatston method.

[Series 2: cascseq 2.0 sa36 70% (id) · axial · 0.39mm/px · z∈[-229,-149]mm · 4 of 68 slices shown]
[im 14/68  vessel]
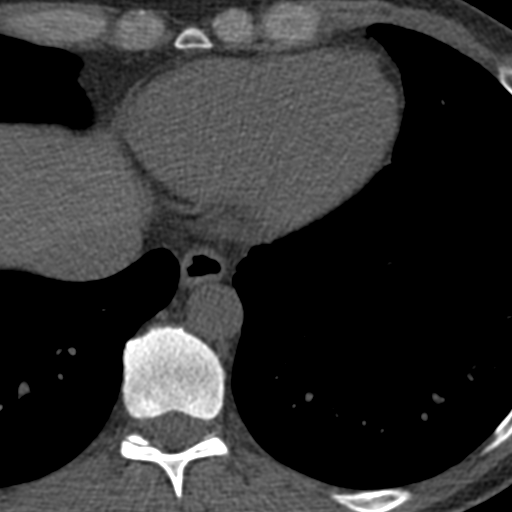
[im 27/68  vessel]
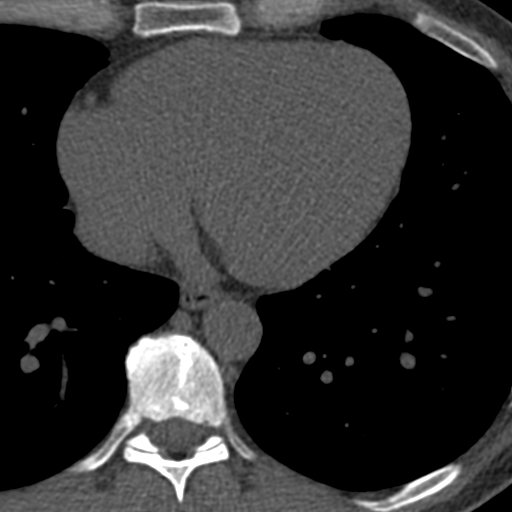
[im 41/68  vessel]
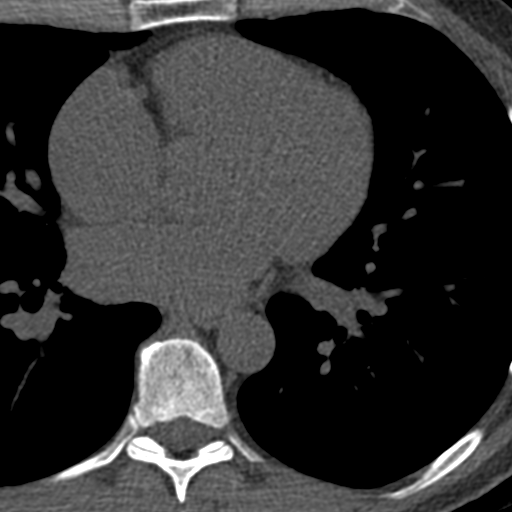
[im 54/68  vessel]
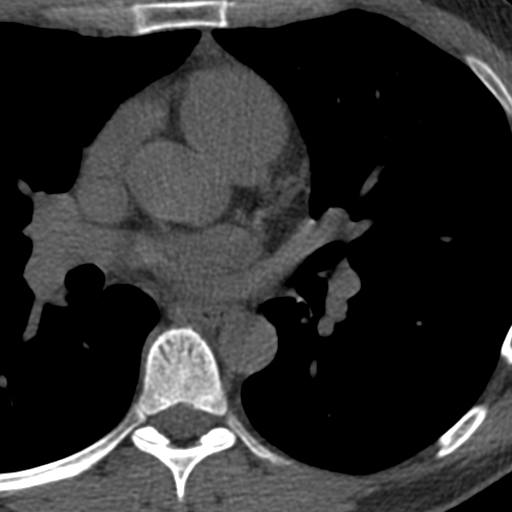

[Series 3: cascseq 2.0 bf37 st · axial · 0.73mm/px · z∈[-233,-145]mm · 5 of 68 slices shown, 7 images]
[im 12/68  vessel]
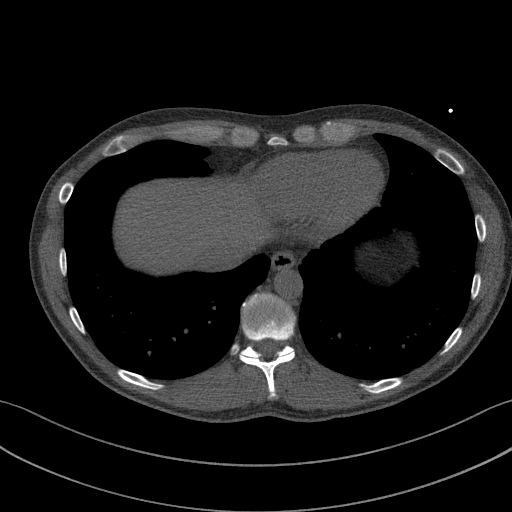
[im 12/68  lung]
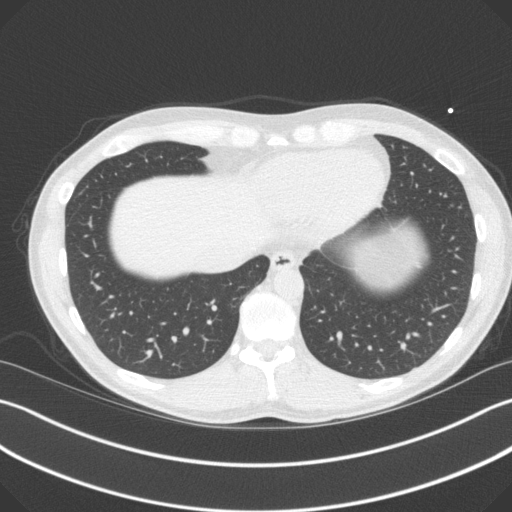
[im 23/68  vessel]
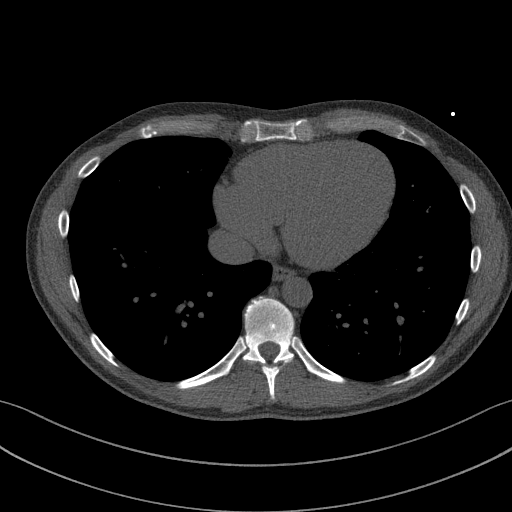
[im 34/68  vessel]
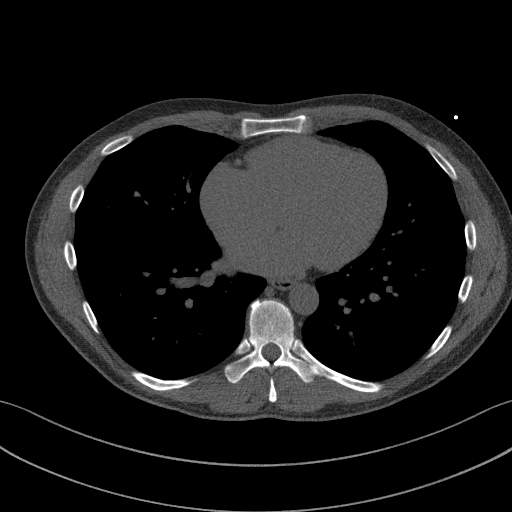
[im 45/68  vessel]
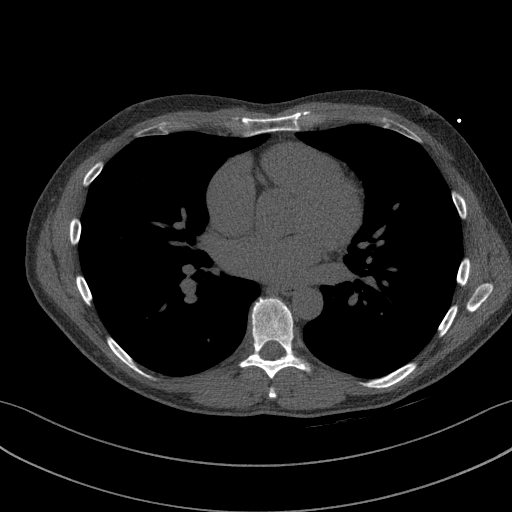
[im 56/68  vessel]
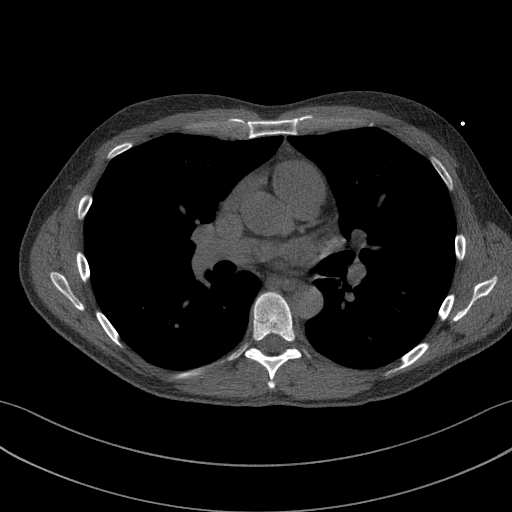
[im 56/68  lung]
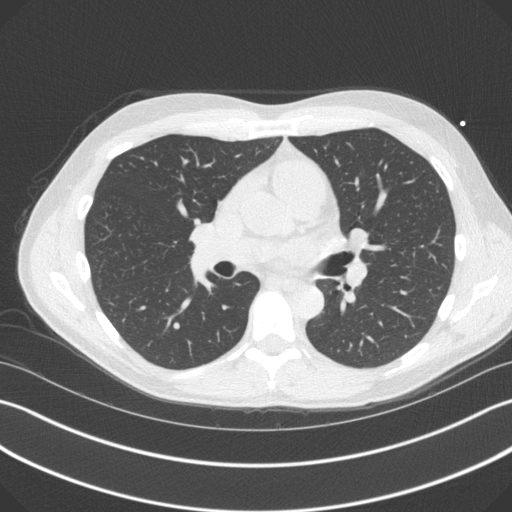

[Series 4: cascseq 2.0 br59 lung · axial · 0.71mm/px · z∈[-233,-145]mm · 5 of 68 slices shown]
[im 12/68  lung]
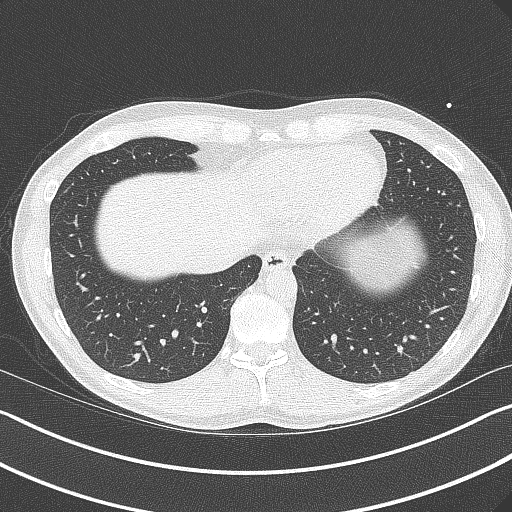
[im 23/68  lung]
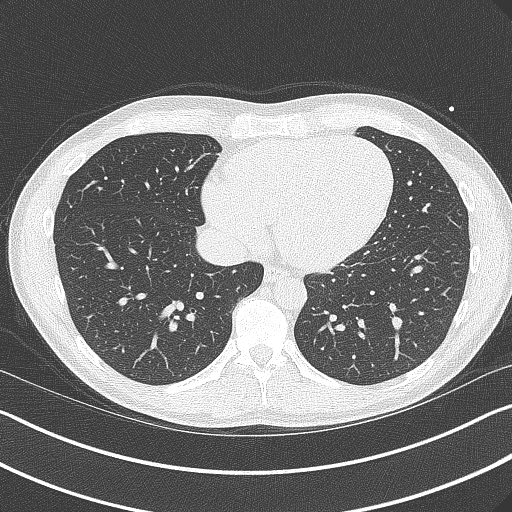
[im 34/68  lung]
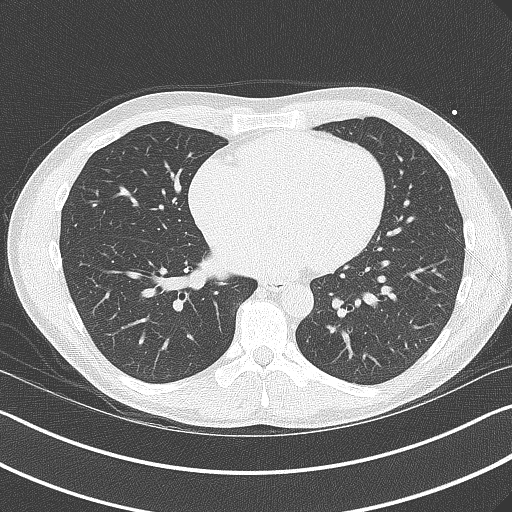
[im 45/68  lung]
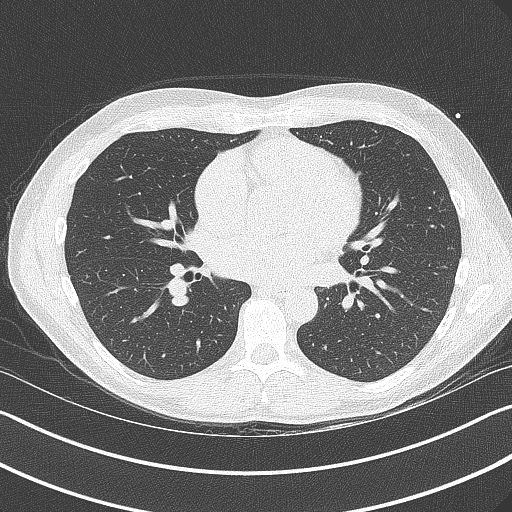
[im 56/68  lung]
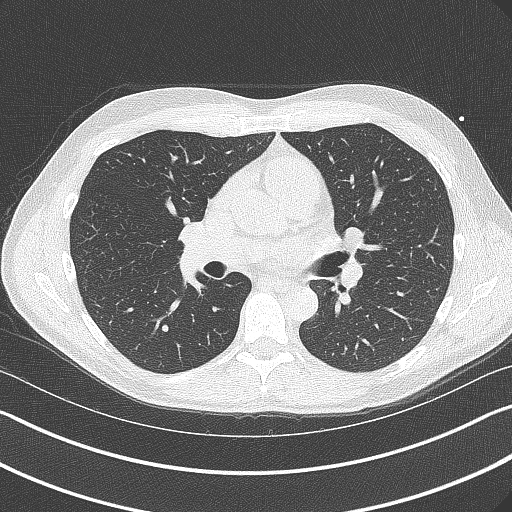

[14 of 20 positions shown; findings below may reference images not displayed]

FINDINGS: Cardiovascular: Normal heart size. No significant pericardial
effusion/thickening. Great vessels are normal in course and caliber.

Mediastinum/Nodes: Unremarkable esophagus. No pathologically
enlarged mediastinal or hilar lymph nodes, noting limited
sensitivity for the detection of hilar adenopathy on this
noncontrast study.

Lungs/Pleura: No pneumothorax. No pleural effusion. No acute
consolidative airspace disease, lung masses or significant pulmonary
nodules.

Upper abdomen: No acute abnormality.

Musculoskeletal: No aggressive appearing focal osseous lesions. Mild
thoracic spondylosis.
IMPRESSION: No significant extracardiac findings.

ADDENDUM:
Cardiovascular Disease Risk stratification

EXAM:
Coronary Calcium Score
FINDINGS: Coronary arteries: Normal origins.

Coronary Calcium Score:

Left main: 0

Left anterior descending artery: 0

Left circumflex artery: 0

Right coronary artery: 0

Total: 0

Percentile: 0

Pericardium: Normal.

Ascending Aorta: Normal caliber.

Non-cardiac: See separate report from [REDACTED].
IMPRESSION: Coronary calcium score of 0. This was 0 percentile for age-, race-,
and sex-matched controls.



If CAC=0, it is reasonable to withhold statin therapy and reassess
in 5 to 10 years, as long as higher risk conditions are absent
(diabetes mellitus, family history of premature CHD in first degree
relatives (males <55 years; females <65 years), cigarette smoking,
or LDL >=190 mg/dL).

If CAC is 1 to 99, it is reasonable to initiate statin therapy for
patients >=55 years of age.

If CAC is >=100 or >=75th percentile, it is reasonable to initiate
statin therapy at any age.

Cardiology referral should be considered for patients with CAC
scores >=400 or >=75th percentile.

*5974 AHA/ACC/AACVPR/AAPA/ABC/MC SAID/ARTINIAN/YOLANI/Uday/STOLZE/ISIS/WOJTEK
Guideline on the Management of Blood Cholesterol: A Report of the
American College of Cardiology/American Heart Association Task Force
on Clinical Practice Guidelines. J Am Coll Cardiol.
9513;73(24):3932-3590.

Klpigbb Moolman, DO

The noncardiac portion of this study will be interpreted in separate
report by the radiologist.

*** End of Addendum ***
EXAM:
OVER-READ INTERPRETATION  CT CHEST

The following report is an over-read performed by radiologist Dr.
does not include interpretation of cardiac or coronary anatomy or
pathology. The coronary calcium score interpretation by the
cardiologist is attached.
FINDINGS: Cardiovascular: Normal heart size. No significant pericardial
effusion/thickening. Great vessels are normal in course and caliber.

Mediastinum/Nodes: Unremarkable esophagus. No pathologically
enlarged mediastinal or hilar lymph nodes, noting limited
sensitivity for the detection of hilar adenopathy on this
noncontrast study.

Lungs/Pleura: No pneumothorax. No pleural effusion. No acute
consolidative airspace disease, lung masses or significant pulmonary
nodules.

Upper abdomen: No acute abnormality.

Musculoskeletal: No aggressive appearing focal osseous lesions. Mild
thoracic spondylosis.
IMPRESSION: No significant extracardiac findings.

## 2023-10-07 DIAGNOSIS — M79604 Pain in right leg: Secondary | ICD-10-CM | POA: Diagnosis not present

## 2023-10-07 DIAGNOSIS — M25571 Pain in right ankle and joints of right foot: Secondary | ICD-10-CM | POA: Diagnosis not present

## 2023-10-28 DIAGNOSIS — F606 Avoidant personality disorder: Secondary | ICD-10-CM | POA: Diagnosis not present

## 2023-11-10 DIAGNOSIS — F606 Avoidant personality disorder: Secondary | ICD-10-CM | POA: Diagnosis not present

## 2023-12-10 DIAGNOSIS — F606 Avoidant personality disorder: Secondary | ICD-10-CM | POA: Diagnosis not present

## 2023-12-23 DIAGNOSIS — F606 Avoidant personality disorder: Secondary | ICD-10-CM | POA: Diagnosis not present

## 2023-12-30 DIAGNOSIS — F606 Avoidant personality disorder: Secondary | ICD-10-CM | POA: Diagnosis not present

## 2024-02-15 ENCOUNTER — Encounter: Payer: Self-pay | Admitting: Family Medicine

## 2024-02-15 ENCOUNTER — Ambulatory Visit: Admitting: Family Medicine

## 2024-02-15 VITALS — BP 110/74 | HR 55 | Temp 98.0°F | Wt 181.0 lb

## 2024-02-15 DIAGNOSIS — R1032 Left lower quadrant pain: Secondary | ICD-10-CM

## 2024-02-15 NOTE — Progress Notes (Signed)
" ° °  Subjective:    Patient ID: Marcus Bell, male    DOB: 01-Jan-1976, 48 y.o.   MRN: [id number]  HPI Here for 4 weeks of intermittent mild discomfort in the left testicle. No swelling in the area. No change in urinations or BM's. This began the day after he ran a marathon in [place]  ( this was his seventh). It was very cold, and he wore several layers of clothing that day. He has been running almost every day since then, and he wears several layers of clothing.    Review of Systems  Constitutional: Negative.   Respiratory: Negative.    Cardiovascular: Negative.   Gastrointestinal: Negative.   Genitourinary:  Positive for testicular pain. Negative for dysuria, frequency, genital sores, hematuria, penile swelling, scrotal swelling and urgency.       Objective:   Physical Exam Constitutional:      Appearance: Normal appearance.  Cardiovascular:     Rate and Rhythm: Normal rate and regular rhythm.     Pulses: Normal pulses.     Heart sounds: Normal heart sounds.  Pulmonary:     Effort: Pulmonary effort is normal.     Breath sounds: Normal breath sounds.  Abdominal:     Hernia: No hernia is present.  Genitourinary:    Comments: The left epididymus is slightly tender. The testicle is normal.  Neurological:     Mental Status: He is alert.           Assessment & Plan:  Left groin pain. This seems to be a mechanical problem from minor trauma while running. I asked hi to stop all exercising for several weeks to let this heal. We will recheck this at his well exam next month.  Garnette Olmsted, MD   "

## 2024-02-25 ENCOUNTER — Ambulatory Visit (INDEPENDENT_AMBULATORY_CARE_PROVIDER_SITE_OTHER): Admitting: Family Medicine

## 2024-02-25 ENCOUNTER — Encounter: Payer: Self-pay | Admitting: Family Medicine

## 2024-02-25 ENCOUNTER — Ambulatory Visit: Payer: Self-pay | Admitting: Family Medicine

## 2024-02-25 VITALS — BP 98/60 | HR 54 | Temp 97.9°F | Ht 72.0 in | Wt 180.0 lb

## 2024-02-25 DIAGNOSIS — Z1283 Encounter for screening for malignant neoplasm of skin: Secondary | ICD-10-CM

## 2024-02-25 DIAGNOSIS — Z0000 Encounter for general adult medical examination without abnormal findings: Secondary | ICD-10-CM | POA: Diagnosis not present

## 2024-02-25 DIAGNOSIS — Z23 Encounter for immunization: Secondary | ICD-10-CM | POA: Diagnosis not present

## 2024-02-25 LAB — BASIC METABOLIC PANEL WITH GFR
BUN: 15 mg/dL (ref 6–23)
CO2: 32 meq/L (ref 19–32)
Calcium: 9.4 mg/dL (ref 8.4–10.5)
Chloride: 101 meq/L (ref 96–112)
Creatinine, Ser: 0.83 mg/dL (ref 0.40–1.50)
GFR: 102.19 mL/min
Glucose, Bld: 95 mg/dL (ref 70–99)
Potassium: 4.3 meq/L (ref 3.5–5.1)
Sodium: 140 meq/L (ref 135–145)

## 2024-02-25 LAB — CBC WITH DIFFERENTIAL/PLATELET
Basophils Absolute: 0 K/uL (ref 0.0–0.1)
Basophils Relative: 0.8 % (ref 0.0–3.0)
Eosinophils Absolute: 0.1 K/uL (ref 0.0–0.7)
Eosinophils Relative: 3.1 % (ref 0.0–5.0)
HCT: 47.7 % (ref 39.0–52.0)
Hemoglobin: 16.2 g/dL (ref 13.0–17.0)
Lymphocytes Relative: 36.4 % (ref 12.0–46.0)
Lymphs Abs: 1.7 K/uL (ref 0.7–4.0)
MCHC: 33.9 g/dL (ref 30.0–36.0)
MCV: 90.7 fl (ref 78.0–100.0)
Monocytes Absolute: 0.4 K/uL (ref 0.1–1.0)
Monocytes Relative: 8.6 % (ref 3.0–12.0)
Neutro Abs: 2.4 K/uL (ref 1.4–7.7)
Neutrophils Relative %: 51.1 % (ref 43.0–77.0)
Platelets: 196 K/uL (ref 150.0–400.0)
RBC: 5.26 Mil/uL (ref 4.22–5.81)
RDW: 13.2 % (ref 11.5–15.5)
WBC: 4.7 K/uL (ref 4.0–10.5)

## 2024-02-25 LAB — LIPID PANEL
Cholesterol: 228 mg/dL — ABNORMAL HIGH (ref 28–200)
HDL: 61.5 mg/dL
LDL Cholesterol: 147 mg/dL — ABNORMAL HIGH (ref 10–99)
NonHDL: 166.23
Total CHOL/HDL Ratio: 4
Triglycerides: 97 mg/dL (ref 10.0–149.0)
VLDL: 19.4 mg/dL (ref 0.0–40.0)

## 2024-02-25 LAB — HEPATIC FUNCTION PANEL
ALT: 25 U/L (ref 3–53)
AST: 23 U/L (ref 5–37)
Albumin: 4.7 g/dL (ref 3.5–5.2)
Alkaline Phosphatase: 46 U/L (ref 39–117)
Bilirubin, Direct: 0.1 mg/dL (ref 0.1–0.3)
Total Bilirubin: 0.8 mg/dL (ref 0.2–1.2)
Total Protein: 7.4 g/dL (ref 6.0–8.3)

## 2024-02-25 LAB — PSA: PSA: 0.56 ng/mL (ref 0.10–4.00)

## 2024-02-25 LAB — TSH: TSH: 1.69 u[IU]/mL (ref 0.35–5.50)

## 2024-02-25 LAB — HEMOGLOBIN A1C: Hgb A1c MFr Bld: 5.7 % (ref 4.6–6.5)

## 2024-02-25 NOTE — Progress Notes (Signed)
 "  Subjective:    Patient ID: Marcus Bell, male    DOB: October 23, 1975, 49 y.o.   MRN: [id number]  HPI Here for a well exam. He is doing well. We saw him on 02-15-24 for a left groin strain, and we advised him to take a break from running. He has done so, and the pain has almost disappeared.    Review of Systems  Constitutional: Negative.   HENT: Negative.    Eyes: Negative.   Respiratory: Negative.    Cardiovascular: Negative.   Gastrointestinal: Negative.   Genitourinary: Negative.   Musculoskeletal: Negative.   Skin: Negative.   Neurological: Negative.   Psychiatric/Behavioral: Negative.         Objective:   Physical Exam Constitutional:      General: He is not in acute distress.    Appearance: Normal appearance. He is well-developed. He is not diaphoretic.  HENT:     Head: Normocephalic and atraumatic.     Right Ear: External ear normal.     Left Ear: External ear normal.     Nose: Nose normal.     Mouth/Throat:     Pharynx: No oropharyngeal exudate.  Eyes:     General: No scleral icterus.       Right eye: No discharge.        Left eye: No discharge.     Conjunctiva/sclera: Conjunctivae normal.     Pupils: Pupils are equal, round, and reactive to light.  Neck:     Thyroid : No thyromegaly.     Vascular: No JVD.     Trachea: No tracheal deviation.  Cardiovascular:     Rate and Rhythm: Normal rate and regular rhythm.     Pulses: Normal pulses.     Heart sounds: Normal heart sounds. No murmur heard.    No friction rub. No gallop.  Pulmonary:     Effort: Pulmonary effort is normal. No respiratory distress.     Breath sounds: Normal breath sounds. No wheezing or rales.  Chest:     Chest wall: No tenderness.  Abdominal:     General: Bowel sounds are normal. There is no distension.     Palpations: Abdomen is soft. There is no mass.     Tenderness: There is no abdominal tenderness. There is no guarding or rebound.  Genitourinary:    Penis: Normal. No  tenderness.      Testes: Normal.     Prostate: Normal.     Rectum: Normal. Guaiac result negative.  Musculoskeletal:        General: No tenderness. Normal range of motion.     Cervical back: Neck supple.  Lymphadenopathy:     Cervical: No cervical adenopathy.  Skin:    General: Skin is warm and dry.     Coloration: Skin is not pale.     Findings: No erythema or rash.  Neurological:     General: No focal deficit present.     Mental Status: He is alert and oriented to person, place, and time.     Cranial Nerves: No cranial nerve deficit.     Motor: No abnormal muscle tone.     Coordination: Coordination normal.     Deep Tendon Reflexes: Reflexes are normal and symmetric. Reflexes normal.  Psychiatric:        Mood and Affect: Mood normal.        Behavior: Behavior normal.        Thought Content: Thought content normal.  Judgment: Judgment normal.           Assessment & Plan:  Well exam. We discussed diet and exercise. Get fasting labs. The groin strain is healing. He plans to resume running short distances next week.  Garnette Olmsted, MD    "

## 2024-11-07 ENCOUNTER — Ambulatory Visit: Admitting: Physician Assistant
